# Patient Record
Sex: Female | Born: 1975 | Race: White | Hispanic: No | Marital: Married | Smoking: Never smoker
Health system: Southern US, Academic
[De-identification: ages and names within clinical notes are randomized; demographics above are authoritative.]

---

## 2015-11-29 ENCOUNTER — Observation Stay (EMERGENCY_DEPARTMENT_HOSPITAL): Payer: No Typology Code available for payment source

## 2015-11-29 ENCOUNTER — Telehealth (INDEPENDENT_AMBULATORY_CARE_PROVIDER_SITE_OTHER): Payer: Self-pay | Admitting: Neurology

## 2015-11-29 ENCOUNTER — Observation Stay (HOSPITAL_BASED_OUTPATIENT_CLINIC_OR_DEPARTMENT_OTHER): Payer: No Typology Code available for payment source | Admitting: Neurology

## 2015-11-29 ENCOUNTER — Emergency Department (HOSPITAL_COMMUNITY): Payer: No Typology Code available for payment source

## 2015-11-29 ENCOUNTER — Observation Stay
Admission: EM | Admit: 2015-11-29 | Discharge: 2015-11-30 | Disposition: A | Discharge status: Home / Self Care | Payer: No Typology Code available for payment source | Source: Other Acute Inpatient Hospital | Attending: Internal Medicine | Admitting: Internal Medicine

## 2015-11-29 ENCOUNTER — Observation Stay (HOSPITAL_BASED_OUTPATIENT_CLINIC_OR_DEPARTMENT_OTHER): Payer: No Typology Code available for payment source

## 2015-11-29 DIAGNOSIS — R51 Headache: Secondary | ICD-10-CM

## 2015-11-29 DIAGNOSIS — Z114 Encounter for screening for human immunodeficiency virus [HIV]: Secondary | ICD-10-CM | POA: Insufficient documentation

## 2015-11-29 DIAGNOSIS — R202 Paresthesia of skin: Secondary | ICD-10-CM

## 2015-11-29 DIAGNOSIS — J45909 Unspecified asthma, uncomplicated: Secondary | ICD-10-CM

## 2015-11-29 DIAGNOSIS — G43109 Migraine with aura, not intractable, without status migrainosus: Principal | ICD-10-CM | POA: Insufficient documentation

## 2015-11-29 DIAGNOSIS — Z882 Allergy status to sulfonamides status: Secondary | ICD-10-CM | POA: Insufficient documentation

## 2015-11-29 DIAGNOSIS — R079 Chest pain, unspecified: Secondary | ICD-10-CM

## 2015-11-29 DIAGNOSIS — R200 Anesthesia of skin: Secondary | ICD-10-CM

## 2015-11-29 DIAGNOSIS — R519 Headache, unspecified: Secondary | ICD-10-CM

## 2015-11-29 DIAGNOSIS — Z889 Allergy status to unspecified drugs, medicaments and biological substances status: Secondary | ICD-10-CM | POA: Insufficient documentation

## 2015-11-29 LAB — CBC WITH DIFF
BASOPHIL #: 0.05 x10ˆ3/uL (ref 0.00–0.20)
BASOPHIL %: 1 %
EOSINOPHIL #: 0.23 x10ˆ3/uL (ref 0.00–0.50)
EOSINOPHIL %: 3 %
HCT: 44.9 % (ref 33.5–45.2)
HGB: 15.1 g/dL (ref 11.2–15.2)
LYMPHOCYTE #: 2.66 x10?3/uL (ref 1.00–4.80)
LYMPHOCYTE %: 28 %
MCH: 30.5 pg (ref 27.4–33.0)
MCHC: 33.5 g/dL (ref 32.5–35.8)
MCV: 91 fL (ref 78.0–100.0)
MONOCYTE #: 0.68 x10ˆ3/uL (ref 0.30–1.00)
MONOCYTE %: 7 %
MPV: 10.7 fL (ref 7.5–11.5)
NEUTROPHIL #: 5.76 x10ˆ3/uL (ref 1.50–7.70)
NEUTROPHIL %: 62 %
PLATELETS: 206 x10ˆ3/uL (ref 140–450)
RBC: 4.94 x10ˆ6/uL — ABNORMAL HIGH (ref 3.63–4.92)
RDW: 13.1 % (ref 12.0–15.0)
WBC: 9.4 x10ˆ3/uL (ref 3.5–11.0)

## 2015-11-29 LAB — THYROXINE, FREE (FREE T4): THYROXINE (T4), FREE: 0.88 ng/dL (ref 0.70–1.25)

## 2015-11-29 LAB — ECG 12-LEAD
Atrial Rate: 75 {beats}/min
Calculated P Axis: 53 degrees
Calculated R Axis: 65 degrees
Calculated T Axis: 58 degrees
PR Interval: 126 ms
QRS Duration: 78 ms
QT Interval: 402 ms
QTC Calculation: 448 ms
Ventricular rate: 75 {beats}/min
Ventricular rate: 75 {beats}/min

## 2015-11-29 LAB — BASIC METABOLIC PANEL
ANION GAP: 8 mmol/L (ref 4–13)
ANION GAP: 8 mmol/L (ref 4–13)
BUN/CREA RATIO: 16 (ref 6–22)
BUN: 12 mg/dL (ref 8–25)
CALCIUM: 8.9 mg/dL (ref 8.5–10.2)
CHLORIDE: 105 mmol/L (ref 96–111)
CO2 TOTAL: 26 mmol/L (ref 22–32)
CREATININE: 0.73 mg/dL (ref 0.49–1.10)
ESTIMATED GFR: >59 mL/min/1.73m?2 (ref >59)
GLUCOSE: 84 mg/dL (ref 65–139)
POTASSIUM: 3.8 mmol/L (ref 3.5–5.1)
SODIUM: 139 mmol/L (ref 136–145)

## 2015-11-29 LAB — PT/INR
INR: 1 (ref 0.80–1.20)
PROTHROMBIN TIME: 11.3 s (ref 9.0–13.6)

## 2015-11-29 LAB — TROPONIN-I (FOR ED ONLY): TROPONIN I: <7 ng/L (ref 0–30)

## 2015-11-29 LAB — C-REACTIVE PROTEIN(CRP),INFLAMMATION: CRP INFLAMMATION: <1 mg/L (ref <=8.0)

## 2015-11-29 LAB — TYPE AND SCREEN
ABO/RH(D): A NEG
ANTIBODY SCREEN: NEGATIVE

## 2015-11-29 LAB — PTT (PARTIAL THROMBOPLASTIN TIME): APTT: 31 s (ref 25.1–36.5)

## 2015-11-29 LAB — HIV-1 AND HIV-2 ANTIBODY CONFIRMATION AND DIFFERENTIATION, SERUM
HIV-1 AB DIFFERENTIATION, S: NEGATIVE
HIV-2 AB DIFFERENTIATION, S: UNDETERMINED

## 2015-11-29 LAB — THYROID STIMULATING HORMONE WITH FREE T4 REFLEX: TSH: 6.218 u[IU]/mL — ABNORMAL HIGH (ref 0.350–5.000)

## 2015-11-29 LAB — HCG, SERUM QUALITATIVE, PREGNANCY: PREGNANCY, SERUM QUALITATIVE: NEGATIVE

## 2015-11-29 LAB — VITAMIN B12: VITAMIN B 12: 239 pg/mL (ref 200–1000)

## 2015-11-29 LAB — SEDIMENTATION RATE: ERYTHROCYTE SEDIMENTATION RATE (ESR): 5 mm/h (ref 0–20)

## 2015-11-29 LAB — POC BLOOD GLUCOSE (RESULTS): GLUCOSE, POC: 86 mg/dL (ref 70–105)

## 2015-11-29 LAB — HIV1/HIV2 SCREEN, COMBINED ANTIGEN AND ANTIBODY: HIV SCREEN, COMBINED ANTIGEN & ANTIBODY: REACTIVE — CR

## 2015-11-29 MED ORDER — PROCHLORPERAZINE EDISYLATE 10 MG/2 ML (5 MG/ML) INJECTION SOLUTION
10.0000 mg | Freq: Three times a day (TID) | INTRAMUSCULAR | Status: DC
Start: 2015-11-29 — End: 2015-11-30
  Administered 2015-11-29 (×2): 10 mg via INTRAVENOUS
  Administered 2015-11-30: 0 mg via INTRAVENOUS
  Administered 2015-11-30: 10 mg via INTRAVENOUS
  Filled 2015-11-29 (×6): qty 2

## 2015-11-29 MED ORDER — SODIUM CHLORIDE 0.9 % INTRAVENOUS SOLUTION
INTRAVENOUS | Status: DC
Start: 2015-11-29 — End: 2015-11-29
  Administered 2015-11-29: 0 via INTRAVENOUS

## 2015-11-29 MED ORDER — AMITRIPTYLINE 50 MG TABLET
50.00 mg | ORAL_TABLET | Freq: Every evening | ORAL | Status: DC
Start: 2015-11-29 — End: 2015-11-30
  Administered 2015-11-29: 50 mg via ORAL
  Filled 2015-11-29 (×2): qty 1

## 2015-11-29 MED ORDER — GADOBENATE DIMEGLUMINE 529 MG/ML(0.1 MMOL/0.2 ML) INTRAVENOUS SOLUTION
18.00 mL | INTRAVENOUS | Status: AC
Start: 2015-11-29 — End: 2015-11-29
  Administered 2015-11-29: 22:00:00 18 mL via INTRAVENOUS

## 2015-11-29 MED ORDER — TIZANIDINE 4 MG TABLET
4.00 mg | ORAL_TABLET | Freq: Three times a day (TID) | ORAL | Status: DC | PRN
Start: 2015-11-29 — End: 2015-11-30
  Administered 2015-11-29: 4 mg via ORAL
  Filled 2015-11-29 (×2): qty 1

## 2015-11-29 MED ORDER — CETIRIZINE 10 MG TABLET
10.0000 mg | ORAL_TABLET | Freq: Every day | ORAL | Status: DC
Start: 2015-11-29 — End: 2015-11-30
  Administered 2015-11-29: 0 mg via ORAL
  Administered 2015-11-30: 10 mg via ORAL
  Filled 2015-11-29 (×2): qty 1

## 2015-11-29 MED ORDER — IOPAMIDOL 370 MG IODINE/ML (76 %) INTRAVENOUS SOLUTION
110.00 mL | INTRAVENOUS | Status: AC
Start: 2015-11-29 — End: 2015-11-29
  Administered 2015-11-29: 110 mL via INTRAVENOUS

## 2015-11-29 MED ORDER — DULOXETINE 60 MG CAPSULE,DELAYED RELEASE
60.0000 mg | DELAYED_RELEASE_CAPSULE | Freq: Every day | ORAL | Status: DC
Start: 2015-11-29 — End: 2015-11-30
  Administered 2015-11-29 – 2015-11-30 (×2): 60 mg via ORAL
  Filled 2015-11-29 (×2): qty 1

## 2015-11-29 MED ORDER — MAGNESIUM SULFATE 1 GRAM/100 ML IN DEXTROSE 5 % INTRAVENOUS PIGGYBACK
1.0000 g | INJECTION | Freq: Three times a day (TID) | INTRAVENOUS | Status: DC
Start: 2015-11-29 — End: 2015-11-30
  Administered 2015-11-29: 1 g via INTRAVENOUS
  Administered 2015-11-29: 0 g via INTRAVENOUS
  Administered 2015-11-29: 1 g via INTRAVENOUS
  Administered 2015-11-30: 0 g via INTRAVENOUS
  Administered 2015-11-30: 1 g via INTRAVENOUS
  Filled 2015-11-29 (×7): qty 100

## 2015-11-29 MED ORDER — SODIUM CHLORIDE 0.9 % INTRAVENOUS SOLUTION
INTRAVENOUS | Status: DC
Start: 2015-11-29 — End: 2015-11-30

## 2015-11-29 MED ORDER — ENOXAPARIN 40 MG/0.4 ML SUBCUTANEOUS SYRINGE
40.0000 mg | INJECTION | SUBCUTANEOUS | Status: DC
Start: 2015-11-29 — End: 2015-11-30
  Administered 2015-11-29: 40 mg via SUBCUTANEOUS
  Filled 2015-11-29: qty 0.4
  Filled 2015-11-29: qty 0

## 2015-11-29 MED ORDER — DIPHENHYDRAMINE 50 MG/ML INJECTION SOLUTION
25.0000 mg | Freq: Three times a day (TID) | INTRAMUSCULAR | Status: DC
Start: 2015-11-29 — End: 2015-11-30
  Administered 2015-11-29 (×2): 25 mg via INTRAVENOUS
  Administered 2015-11-30: 0 mg via INTRAVENOUS
  Administered 2015-11-30: 25 mg via INTRAVENOUS
  Filled 2015-11-29 (×3): qty 1

## 2015-11-29 MED ORDER — SODIUM CHLORIDE 0.9 % INTRAVENOUS SOLUTION
INTRAVENOUS | Status: DC
Start: 2015-11-29 — End: 2015-11-29

## 2015-11-29 MED ORDER — ACETAMINOPHEN 325 MG TABLET
650.0000 mg | ORAL_TABLET | ORAL | Status: DC | PRN
Start: 2015-11-29 — End: 2015-11-29

## 2015-11-29 MED ORDER — SODIUM CHLORIDE 0.9 % IV BOLUS
500.0000 mL | INJECTION | Freq: Once | Status: AC
Start: 2015-11-29 — End: 2015-11-29
  Administered 2015-11-29: 500 mL via INTRAVENOUS

## 2015-11-29 MED ORDER — ACETAMINOPHEN 325 MG TABLET
650.0000 mg | ORAL_TABLET | ORAL | Status: DC | PRN
Start: 2015-11-29 — End: 2015-11-30

## 2015-11-29 MED ADMIN — PRISMASATE (BICARBONATE-BASED) 4K DIALYSATE: INTRAVENOUS | @ 06:00:00

## 2015-11-29 MED ADMIN — artificial tears with lanolin eye ointment: INTRAVENOUS | @ 07:00:00 | NDC 00023031204

## 2015-11-29 MED ADMIN — lactated Ringers intravenous solution: INTRAVENOUS | @ 07:00:00 | NDC 00264775000

## 2015-11-29 NOTE — ED Provider Notes (Signed)
Department of Emergency Medicine    CC:  Stroke page    HPI:  11/29/2015, 07:26  40 y.o.female Stroke Page.    EMS Reported Last Seen Normal:  21 30  Official Last Seen Normal (Used for TPA eligibility):  2130  Reason for discrepancy between EMS last seen normal and official last seen normal:  Not applicable    Gloria Nielsen is a 40 y.o. female presenting to the ED via EMS as a transfer from [facility] port.  Patient states that she woke up at 21:45 experiencing left facial droop and left-sided numbness.  Patient states that she went to bed at 2130.  Patient reports having improvement in her left-sided numbness; however it continues to persist.  Patient was evaluated at [facility].  CT head was negative and telemedicine was performed.  Patient was advised to come to [facility] for further evaluation.  On route, patient had a fingerstick of 105. Patient currently complaining of a headache. No previous history of stroke.  History is significant for migraine and PNES.   Patient reports that she has been having severe migraines in the last few weeks.  Patient has been adherent to her migraine medication.        Review of Systems:  Constitutional: No fever, chills or np weakness   Skin: No rash or diaphoresis  HENT:  Positive headache  Eyes: No vision changes   Cardio: No chest pain, palpitations or leg swelling   Respiratory: No cough, wheezing or SOB  GI:  No nausea, vomiting or stool changes  GU:  No urinary changes  MSK: No joint or back pain  Neuro: No seizures, LOC, weakness or dysarthria.  Positive for left-sided numbness and tingling  Psychiatric: No depression, SI or substance abuse  All other systems reviewed and are negative or as noted in HPI.    History  PMH:  Migraine and PNES    Previous Medications    ALBUTEROL SULFATE (PROVENTIL OR VENTOLIN OR PROAIR) 90 MCG/ACTUATION INHALATION HFA AEROSOL INHALER    Take 1-2 Puffs by inhalation Every 6 hours as needed    AMITRIPTYLINE (ELAVIL) 50 MG ORAL TABLET     Take 50 mg by mouth Every night    CETIRIZINE (ZYRTEC) 10 MG ORAL TABLET    Take 10 mg by mouth Once a day    DULOXETINE (CYMBALTA DR) 60 MG ORAL CAPSULE, DELAYED RELEASE(E.C.)    Take 60 mg by mouth Once a day    RIZATRIPTAN (MAXALT) 10 MG ORAL TABLET    Take 10 mg by mouth Once, as needed for Migraine May repeat in 2 hours if       PSH:  No past surgical history on file.      Social Hx:    Social History   Substance Use Topics    Smoking status: Not on file    Smokeless tobacco: Not on file    Alcohol use Not on file     Family Hx: No family history on file.  Allergies:   Allergies   Allergen Reactions    Allegra [Fexofenadine] Rash    Sulfa (Sulfonamides) Rash    Tegretol [Carbamazepine] Rash       Above history reviewed with patient, changes are as documented.      Physical Exam:   Nursing notes reviewed.    ED Triage Vitals   Enc Vitals Group      BP (Non-Invasive) 11/29/15 0551 127/75      Heart Rate 11/29/15  0551 87      Respiratory Rate 11/29/15 0551 20      Temperature 11/29/15 0551 36.6 C (97.9 F)      Temp src --       SpO2-1 11/29/15 0551 98 %      Weight 11/29/15 0551 89.8 kg (197 lb 15.9 oz)      Height --       Head Cir --       Peak Flow --       Pain Score --       Pain Loc --       Pain Edu? --       Excl. in GC? --      Constitutional: NAD. Oriented  HENT:   Head: Normocephalic and atraumatic.   Mouth/Throat: Oropharynx is clear and moist.   Eyes: EOMI, PERRL   Neck: Normal ROM. Neck supple.  Cardiovascular: RRR. No murmur, rubs or gallops. Intact distal pulses.  Pulmonary/Chest: BS equal bilaterally. No respiratory distress. No wheezes, rales or tenderness.   Abdominal: BS +. Abdomen soft, no tenderness, rebound or guarding.               Musculoskeletal: No edema, tenderness or deformities   Skin: warm and dry. No rash, erythema or pallor.   Psychiatric: normal mood and affect. Behavior is normal.   Neuro: NIH Stroke Scale completed - following deficits identified:  Decreased sensation  to the left upper extremity . Facies symmetric. Able to raise eyebrows, close eyes, smile, puff mouth, stick out tongue, move tongue left and right and raise palate symmetrically. Able to shrug shoulders. PERRLA. SILT to forehead below eye and at jawline. Can hear soft nose bilaterally.   Good finger to nose    Course and MDM:  Course:  Pt was paged as a Stroke Page. Stroke team was present upon arrival of patient to the ED.    Initial evaluation was completed jointly by ED and Neurology house staff. Stroke protocol labs and radiographical studies were initiated. NIH Stroke Scale of  1 was obtained.    Orders Placed This Encounter    XR AP MOBILE CHEST    CT STROKE PROTOCOL    CBC/DIFF    BASIC METABOLIC PANEL, NON-FASTING    PT/INR    PTT (PARTIAL THROMBOPLASTIN TIME)    TROPONIN-I (FOR ED ONLY)    VENOUS BLOOD GAS/K+/ICA++/CO-OX    HCG, SERUM QUALITATIVE, PREGNANCY    URINALYSIS, MACROSCOPIC AND MICROSCOPIC W/CULTURE REFLEX    CBC WITH DIFF    URINALYSIS, MACROSCOPIC    HIV1/HIV2 SCREEN, COMBINED ANTIGEN AND ANTIBODY    SEDIMENTATION RATE    C-REACTIVE PROTEIN(CRP),INFLAMMATION    VITAMIN B12    THYROID STIMULATING HORMONE WITH FREE T4 REFLEX    THYROXINE, FREE (FREE T4)    OXYGEN - NASAL CANNULA    ECG 12-LEAD    PERFORM POC WHOLE BLOOD GLUCOSE    TYPE AND SCREEN    INSERT & MAINTAIN PERIPHERAL IV ACCESS [ 2 [facility] ]    PATIENT CLASS/LEVEL OF CARE DESIGNATION    NS premix infusion    prochlorperazine (COMPAZINE) 5 mg/mL injection    diphenhydrAMINE (BENADRYL) 50 mg/mL injection    magnesium sulfate 1 G in D5W 100 mL premix IVPB    tiZANidine (ZANAFLEX) tablet    acetaminophen (TYLENOL) tablet    iopamidol (ISOVUE-370) 76% infusion       Labs Reviewed   CBC WITH DIFF -  Abnormal; Notable for the following:        Result Value    RBC 4.94 (*)     All other components within normal limits   THYROID STIMULATING HORMONE WITH FREE T4 REFLEX - Abnormal; Notable for  the following:     TSH 6.218 (*)     All other components within normal limits    Narrative:     "STROKE PAGE"   BASIC METABOLIC PANEL - Normal    Narrative:     "STROKE PAGE"  Hemolysis can alter results at this level (slight).   PT/INR - Normal    Narrative:     Coumadin therapy INR range for Conventional Anticoagulation is 2.0 to 3.0 and for Intensive Anticoagulation 2.5 to 3.5.   PTT (PARTIAL THROMBOPLASTIN TIME) - Normal    Narrative:     Therapeutic range for unfractionated heparin is 60.0-100.0 seconds.   TROPONIN-I (FOR ED ONLY) - Normal    Narrative:     "STROKE PAGE"   HCG, SERUM QUALITATIVE, PREGNANCY - Normal    Narrative:     Sensitivity of the qualitative pregnancy test is 25 mIU hCG/mL.  If pregnancy is strongly considered, perform quantitative hCG or repeat in 48 hours.   SEDIMENTATION RATE - Normal   C-REACTIVE PROTEIN(CRP),INFLAMMATION - Normal    Narrative:     "STROKE PAGE"   VITAMIN B12 - Normal   POC BLOOD GLUCOSE (RESULTS) - Normal    Narrative:     RN Notified   CBC/DIFF    Narrative:     The following orders were created for panel order CBC/DIFF.  Procedure                               Abnormality         Status                     ---------                               -----------         ------                     CBC WITH GUYQ[[id number]]                Abnormal            Final result                 Please view results for these tests on the individual orders.   URINALYSIS, MACROSCOPIC AND MICROSCOPIC W/CULTURE REFLEX    Narrative:     The following orders were created for panel order URINALYSIS, MACROSCOPIC AND MICROSCOPIC W/CULTURE REFLEX.  Procedure                               Abnormality         Status                     ---------                               -----------         ------  URINALYSIS, MACROSCOPIC[[id number]]                                                       Please view results for these tests on the individual orders.   URINALYSIS,  MACROSCOPIC   HIV1/HIV2 SCREEN, COMBINED ANTIGEN AND ANTIBODY   THYROXINE, FREE (FREE T4)   PERFORM POC WHOLE BLOOD GLUCOSE   TYPE AND SCREEN   POC ISTAT CREATININE (RESULT)     The patient is out of the tPA window    Meds that were given are noted on the nursing chart.  Medications   NS premix infusion ( Intravenous New Bag/New Syringe 11/29/15 [phone])   prochlorperazine (COMPAZINE) 5 mg/mL injection (10 mg Intravenous Given 11/29/15 0658)   diphenhydrAMINE (BENADRYL) 50 mg/mL injection (25 mg Intravenous Given 11/29/15 0658)   magnesium sulfate 1 G in D5W 100 mL premix IVPB (not administered)   tiZANidine (ZANAFLEX) tablet (not administered)   acetaminophen (TYLENOL) tablet (not administered)   iopamidol (ISOVUE-370) 76% infusion (110 mL Intravenous Given 11/29/15 0650)       EKG:  NSR. No prolonged PR interval. No QRS widening. No STE, No ST depression. No TWI. No QT prolongation. Nml axis. Nml R wave progression. No ischemic changes concerning for MI, No arrhythmia      Radiographical Imaging:   Results for orders placed or performed during the hospital encounter of 11/29/15 (from the past 72 hour(s))   CT STROKE PROTOCOL     Status: None (Preliminary result)    Narrative    Margarette Asal  Female, 40 years old.    CT STROKE PROTOCOL performed on 11/29/2015 7:07 AM.    REASON FOR EXAM:  pulsatile headache    RADIATION DOSE: 4965.70 mGycm    TECHNIQUE: Unenhanced head CT was obtained.  Following intravenous contrast  administration of 110 ml's of Isovue 370, intracranial and extracranial  angiographic imaging was performed along with perfusion imaging.    COMPARISON: None available.    FINDINGS:  NONCONTRAST HEAD CT:  Gray-white matter differentiation is preserved. No  intracranial hemorrhages or extra-axial fluid collections are identified.   There is no midline shift.  There is no evidence of hydrocephalus.  The  mastoid air cells and the paranasal sinuses are well aerated.  No acute  osseous abnormalities are  identified.  The orbits are unremarkable.    INTRACRANIAL CT ANGIOGRAPHY:  The vertebrobasilar system is unremarkable.    The cerebellar arteries are patent.  Bilateral PCAs are unremarkable.  The  visualized bilateral internal carotid arteries are unremarkable.  Bilateral  ACAs and MCAs are unremarkable.  There are no flow-limiting stenosis,  complete occlusion, or vascular malformations.    EXTRACRANIAL CT ANGIOGRAPHY:  There is normal branching configuration of  the aortic arch. The origins of the bilateral common carotid and vertebral  arteries are patent.  There is a codominant vertebral artery system.   Cervical course of the vertebral, common carotid, and internal carotid  arteries is unremarkable. There is no flow-limiting stenosis, complete  occlusion, or blunt cerebrovascular trauma.    PERFUSION IMAGING: There are no regions of hypoperfusion within a major  arterial distribution to suggest a large vessel occlusion.    CT NECK:  The visualized lungs demonstrate no acute abnormality. No acute  osseous abnormalities identified. The thyroid is unremarkable.  Impression    1. Unenhanced head CT demonstrating no abnormal gray-white matter  differentiation or acute intracranial process.  2. Angiographic imaging demonstrating no evidence for thrombosis or  stenosis within any major artery.  3. Perfusion imaging demonstrates no perfusion deficit.         All labs, imaging and previous records were reviewed.  Patient is out of tPA window.  Limited differential includes stroke, TIA, complex migraine, hypoglycemia, Todd's paralysis, brain mass..  Low concern for Todd's paralysis based on lack of seizure activity.  Labs as above.  EKG as above.  Given patient's history of migraine and worsening of chronic migraines, neuro wish to perform CT imaging.  CT stroke protocol protocol demonstrated "1. Unenhanced head CT demonstrating no abnormal gray-white matterdifferentiation or acute intracranial process.  2.  Angiographic imaging demonstrating no evidence for thrombosis orstenosis within any major artery.3. Perfusion imaging demonstrates no perfusion deficit."  Patient to be admitted to Neurology service      For further information refer to Neurology H&P.    Disposition: Admitted   Patient will be admitted to  neurology service for further evaluation and management.      Care of patient will be transferred to Dr. Gary FleetHundley at this time.  They were made aware of history/physical, relevant labs/imaging and pending studies.         Clinical Impression:   Left-sided numbness and tingling, worsening migraines  London PepperPaulina Shifra Swartzentruber, MD  11/29/2015, 05:59

## 2015-11-29 NOTE — ED Resident Handoff Note (Signed)
Allergies   Allergen Reactions   . Allegra [Fexofenadine] Rash   . Sulfa (Sulfonamides) Rash   . Tegretol [Carbamazepine] Rash       Filed Vitals:    11/29/15 0551   BP: 127/75   Pulse: 87   Resp: 20   Temp: 36.6 C (97.9 F)   SpO2: 98%       HPI:  In brief, Margarette Asalina Hayden is a 40 y.o. female presents with transfer from [facility],  Concern for stroke.  Woke up with facial droop.   CT non con neg.    Wake up stroke     Pertinent Exam Findings:  decreased sensation to L side  Otherwise neuro intact    Pertinent Imaging/Lab results:  Labs Reviewed   CBC WITH DIFF - Abnormal; Notable for the following:        Result Value    RBC 4.94 (*)     All other components within normal limits   THYROID STIMULATING HORMONE WITH FREE T4 REFLEX - Abnormal; Notable for the following:     TSH 6.218 (*)     All other components within normal limits    Narrative:     "STROKE PAGE"   BASIC METABOLIC PANEL - Normal    Narrative:     "STROKE PAGE"  Hemolysis can alter results at this level (slight).   PT/INR - Normal    Narrative:     Coumadin therapy INR range for Conventional Anticoagulation is 2.0 to 3.0 and for Intensive Anticoagulation 2.5 to 3.5.   PTT (PARTIAL THROMBOPLASTIN TIME) - Normal    Narrative:     Therapeutic range for unfractionated heparin is 60.0-100.0 seconds.   TROPONIN-I (FOR ED ONLY) - Normal    Narrative:     "STROKE PAGE"   HCG, SERUM QUALITATIVE, PREGNANCY - Normal    Narrative:     Sensitivity of the qualitative pregnancy test is 25 mIU hCG/mL.  If pregnancy is strongly considered, perform quantitative hCG or repeat in 48 hours.   SEDIMENTATION RATE - Normal   C-REACTIVE PROTEIN(CRP),INFLAMMATION - Normal    Narrative:     "STROKE PAGE"   VITAMIN B12 - Normal   POC BLOOD GLUCOSE (RESULTS) - Normal    Narrative:     RN Notified   CBC/DIFF    Narrative:     The following orders were created for panel order CBC/DIFF.  Procedure                               Abnormality         Status                      ---------                               -----------         ------                     CBC WITH ZOXW[[id number]]IFF[[id number]]                Abnormal            Final result                 Please view results for these tests on the individual orders.   URINALYSIS, MACROSCOPIC AND  MICROSCOPIC W/CULTURE REFLEX    Narrative:     The following orders were created for panel order URINALYSIS, MACROSCOPIC AND MICROSCOPIC W/CULTURE REFLEX.  Procedure                               Abnormality         Status                     ---------                               -----------         ------                     URINALYSIS, MACROSCOPIC[[id number]]                                                       Please view results for these tests on the individual orders.   URINALYSIS, MACROSCOPIC   HIV1/HIV2 SCREEN, COMBINED ANTIGEN AND ANTIBODY   THYROXINE, FREE (FREE T4)   PERFORM POC WHOLE BLOOD GLUCOSE   TYPE AND SCREEN   POC ISTAT CREATININE (RESULT)       Results for orders placed or performed during the hospital encounter of 11/29/15 (from the past 72 hour(s))   CT STROKE PROTOCOL     Status: None (Preliminary result)    Narrative    Margarette AsalINA Storts  Female, 40 years old.    CT STROKE PROTOCOL performed on 11/29/2015 7:07 AM.    REASON FOR EXAM:  pulsatile headache    RADIATION DOSE: 4965.70 mGycm    TECHNIQUE: Unenhanced head CT was obtained.  Following intravenous contrast  administration of 110 ml's of Isovue 370, intracranial and extracranial  angiographic imaging was performed along with perfusion imaging.    COMPARISON: None available.    FINDINGS:  NONCONTRAST HEAD CT:  Gray-white matter differentiation is preserved. No  intracranial hemorrhages or extra-axial fluid collections are identified.   There is no midline shift.  There is no evidence of hydrocephalus.  The  mastoid air cells and the paranasal sinuses are well aerated.  No acute  osseous abnormalities are identified.  The orbits are unremarkable.    INTRACRANIAL CT ANGIOGRAPHY:   The vertebrobasilar system is unremarkable.    The cerebellar arteries are patent.  Bilateral PCAs are unremarkable.  The  visualized bilateral internal carotid arteries are unremarkable.  Bilateral  ACAs and MCAs are unremarkable.  There are no flow-limiting stenosis,  complete occlusion, or vascular malformations.    EXTRACRANIAL CT ANGIOGRAPHY:  There is normal branching configuration of  the aortic arch. The origins of the bilateral common carotid and vertebral  arteries are patent.  There is a codominant vertebral artery system.   Cervical course of the vertebral, common carotid, and internal carotid  arteries is unremarkable. There is no flow-limiting stenosis, complete  occlusion, or blunt cerebrovascular trauma.    PERFUSION IMAGING: There are no regions of hypoperfusion within a major  arterial distribution to suggest a large vessel occlusion.    CT NECK:  The visualized lungs demonstrate no acute abnormality. No acute  osseous abnormalities identified. The thyroid is unremarkable.  Impression    1. Unenhanced head CT demonstrating no abnormal gray-white matter  differentiation or acute intracranial process.  2. Angiographic imaging demonstrating no evidence for thrombosis or  stenosis within any major artery.  3. Perfusion imaging demonstrates no perfusion deficit.         Pending Studies:  Nothing     Consults:  Neurology     Plan:  Admit to neurology   After a thorough discussion of the patient including presentation, ED course, and review of above information I have assumed care of Margarette Asal from Dr. Allena Katz at 07:24      Irene Limbo, MD 11/29/2015, 07:24      Course:        Results up to the Time the Disposition was Entered   CBC WITH DIFF - Abnormal; Notable for the following:        Result Value    RBC 4.94 (*)     All other components within normal limits   PT/INR - Normal    Narrative:     Coumadin therapy INR range for Conventional Anticoagulation is 2.0 to 3.0 and for Intensive  Anticoagulation 2.5 to 3.5.   PTT (PARTIAL THROMBOPLASTIN TIME) - Normal    Narrative:     Therapeutic range for unfractionated heparin is 60.0-100.0 seconds.   POC BLOOD GLUCOSE (RESULTS) - Normal    Narrative:     RN Notified   CBC/DIFF    Narrative:     The following orders were created for panel order CBC/DIFF.  Procedure                               Abnormality         Status                     ---------                               -----------         ------                     CBC WITH ZOXW[[id number]]                Abnormal            Final result                 Please view results for these tests on the individual orders.   ECG 12-LEAD   INSERT & MAINTAIN PERIPHERAL IV ACCESS   POC ISTAT CREATININE (RESULT)   PERFORM POC WHOLE BLOOD GLUCOSE   URINALYSIS, MACROSCOPIC AND MICROSCOPIC W/CULTURE REFLEX    Narrative:     The following orders were created for panel order URINALYSIS, MACROSCOPIC AND MICROSCOPIC W/CULTURE REFLEX.  Procedure                               Abnormality         Status                     ---------                               -----------         ------  URINALYSIS, MACROSCOPIC[[id number]]                                                       Please view results for these tests on the individual orders.   DIET NPO - NOW   NURSING: ON COMPLETION OF SWALLOW EVAL   OXYGEN - NASAL CANNULA   PULSE OXIMETRY   VITAL SIGNS MULTI FREQUENCIES ED   NEURO CHECKS MULTI-FREQUENCY   CONTINUOUS CARDIAC MONITORING (ED USE ONLY)   URINALYSIS, MACROSCOPIC   XR AP MOBILE CHEST   NS premix infusion ( Intravenous New Bag/New Syringe 11/29/15 [phone])   prochlorperazine (COMPAZINE) 5 mg/mL injection (not administered)   diphenhydrAMINE (BENADRYL) 50 mg/mL injection (not administered)   magnesium sulfate 1 G in D5W 100 mL premix IVPB (not administered)   tiZANidine (ZANAFLEX) tablet (not administered)   acetaminophen (TYLENOL) tablet (not administered)   iopamidol (ISOVUE-370) 76% infusion  (not administered)         CT stroke protocol with no acute findings.           Disposition:    Patient will be admitted to Neurology service for further evaluation and management.            Irene Limbo, MD 11/29/2015, 07:24  PGY-3   Department of Emergency Medicine

## 2015-11-29 NOTE — Progress Notes (Signed)
Telestroke Consult Note    Gloria Nielsen,Gloria Nielsen, 40 y.o. female  MRN:  [id number]     Date of Admission:  (Not on file)  Date of Birth:  09/11/1975  Date of Service:  11/29/2015  Note Time: 10:59       [facility]  Consult Requested by: Dr. Everardo BealsGeary  Time of Page: 11/29/2015  2322  Time of Connection:  2327       History Obtained From: Patient and her husband      Chief Complaint: Left sided weakness    History of Present Illness:  Gloria Nielsen is a 40 y.o. female with a history of depression and migraine who presented with left sided weakness. She went to bed and woke up 15 minutes later with these symptoms at 2145. She had slurred speech and inability to move her left side. Telestroke was called for further eval.     Patient presented with Husband    Last Known Well: 2130      PHYSICAL EXAMINATION (indirectly performed by observation via teleconference)  Secure teleconferencing connection was sufficient for examination:  yes    Not moving either side of face  Following all commands  EOMI  Speech not really dysarthric but more non-neurological sounding  Trace movement in left arm and leg    NIHSS (approximate)= 10                                                 I personally reviewed the following images.  My findings and interpretations are as follows:  CT brain w/o contrast  No blood  CTA (if available)  NA    Assessment:  1. Ischemic Stroke: Unlikley    Recommendations:  1. IV tPA administration:  no  2. CTA/ Endovascular Evaluation:  no  3. Transfer for higher level of care:  yes    Gloria Nielsen is a 40 yo F with h/o migraines and depression who [facility] with left sided weakness and abnormal speech in association with a headache. Her exam participation was poor and not entirely consistent with a stroke. My suspicion was low for a stroke but I could not rule it out completely. I offered her treatment with IV tPA even though the likelihood of stroke was low. She and her husband decided not to be  treated with tPA. [facility].       Huel CoteMuhammad Doninique Lwin, MD  11/29/2015, 10:59

## 2015-11-29 NOTE — ED Nurses Note (Signed)
Voice care recorded 5W room 520.

## 2015-11-29 NOTE — H&P (Signed)
NIH Stroke Scale    Inital assessment  Date of EXAM: 11/29/15        Time of EXAM: 0550  Person Administering Scale: Enis GashVincent Arnone, MD      1a.  0 Level of consciousness:     0 = Alert; keenly responsive.   1 = Not alert; but arousable by minor stimulation   2 = Not alert; requires repeated stimulation  3 = COMA   1b. 0 LOC questions:   Ask patient month/Their age.   0 = Answers both questions correctly.   1 = Answers one question correctly.   2 = Answers neither question correctly.     1c. 0 LOC commands:  Ask patient to open/close eyes.   0 = OBEYS both tasks correctly.   1 = OBEYS one task correctly.   2 = OBEYS neither task correctly.     2.  0 Best Gaze:  Only horizontal eye movements will be tested.   0 = Normal.   1 = Partial gaze palsy  2 = Forced deviation   3. 0  Visual:   0 = No visual loss.   1 = Partial hemianopia.   2 = Complete hemianopia.   3 = Bilateral hemianopia (blind including cortical blindness).       4. 0 Facial Palsy:  Ask patient to show teeth or raise eyebrows and close eyes. 0 = Normal symmetrical movements.   1 = Minor paralysis (flattened nasolabial fold, asymmetry on smiling).   2 = Partial paralysis (total or near-total paralysis of lower face).   3 = Complete paralysis of one or both sides (absence of facial movement in the upper and lower face).     5a. 0  Motor left arm:     0 = No drift; limb holds 90 (or 45) degrees for full 10 seconds.   1 = Drift  2 = Some effort against gravity  3 = No effort against gravity  4 = No movement.   9 = UNTESTABLE(Joint fused/limb amputated)     5b.  0 Motor right arm:     0 = No drift; limb holds 90 (or 45) degrees for full 10 seconds.   1 = Drift  2 = Some effort against gravity  3 = No effort against gravity  4 = No movement.   9 = UNTESTABLE(Joint fused/limb amputated)     6a. 0 Motor left leg:     0 = No drift; leg holds 30-degree position for full 5 seconds.   1 = Drift  2 = Some effort against gravity  3 = No effort against gravity  4 = No  movement.   9 = UNTESTABLE(Joint fused/lim amputated)       6b.  0 Motor right leg:      0 = No drift; leg holds 30-degree position for full 5 seconds.   1 = Drift  2 = Some effort against gravity  3 = No effort against gravity  4 = No movement.   9 = UNTESTABLE(Joint fused/lim amputated)     7. 0 Limb Ataxia:     0 = No Ataxia  1 = Present in 1 limb.   2 = Present in 2 limbs.         8.  1 Sensory:  Use pinprick to test arms/legs/trunk/face,compare side to side.   0 = Normal; no sensory loss.   1 = Mild-to-moderate sensory loss  2 = Severe to total sensory  loss     9. 0 Best Language:   Describe picture/name items/read sentences.   0 = No aphasia; normal.  1 = Mild-to-moderate aphasia  2 = Severe aphasia  3 = Mute       10. 0 Dysarthria:   (Read several words)     0 = Normal Articulation  1 = Mild-to-moderate slurring  2 = near unintelligible or unalbe to speak  9 = Intubated or other physical barrier   11.  0 Extinction and Inattention:     0 = Normal  1 = Inattention or extinction to bilateral simultaneous stimulation in one sensory modality  2 = Severe Hemi-inattention or Hem-attention to more than one modality            Total SCORE:  1     Enis GashVincent Arnone, MD  11/29/2015, 06:08

## 2015-11-29 NOTE — ED Attending Note (Signed)
Note begun by: Ezzie Duralharles R Qais Jowers, MD  11/29/2015, 05:58    I was physically present and directly supervised this patient's care.  Patient was seen and examined.  The resident/NP/PA's history and exam were reviewed.  Key elements in addition to and/or correction of that documentation are as follows:  Vitals  Pulse 87, temperature 36.6 C (97.9 F), resp. rate 20, SpO2 98 %..  This is a 40 y.o. female.   Transferred for eval of acute neurological event. l side weakness and r leg weakness. patient declined tpa at outside facility. Hx migraine and seizure.           Old records were reviewed.      Clinical Impression:     Encounter Diagnoses   Name Primary?    Numbness and tingling Yes    Worsening headaches        Critical Care:  None    Some of the documentation has been completed after the conclusion of patient care due to bedside patient care needs and the acuity of the department.

## 2015-11-29 NOTE — Nurses Notes (Signed)
Blood cultures drawn and sent to lab per orders.

## 2015-11-29 NOTE — ED Nurses Note (Signed)
Patient arrived to ED 32 as stroke page with c/o left sided facial tingling. Patient has hx of migraines, states that she has had worsening migraines for the last week. Current symptoms similar to previous migraines, but more intense than usual tonight. LSN 2145. FS 86. NIH completed. 20g PIV to LFA and RFA present prior to admission,  bloodwork drawn from site and sent to lab. Neurology at bedside, stroke page downgraded at this time.

## 2015-11-29 NOTE — ED Nurses Note (Signed)
Pt resting comfortably in stretcher with no complaints at this time.

## 2015-11-29 NOTE — Incoming ED Transfer Note (Signed)
00:44    [facility]      HPI:  Onset symptoms at 945 pm. NIH score 20, flaccid l side, ct negative, offered tpa and patient and family refused tpa.  Hx of stroke and seizure.     PE:   l side weak some improvement with time    Imaging:     CT Head:  No Bleed      Significant Labs:  normal*    Mode of Transport:  Ground,     Outside facility Spoke with:  Neurology by telestroke consult, tpa recommended and declined by patient and family.     Stroke page

## 2015-11-29 NOTE — Ancillary Notes (Signed)
[facility]  MRI Technologist Note        MRI has been completed.        Sindy MessingOlivia Hare 11/29/2015, 22:27

## 2015-11-29 NOTE — ED Nurses Note (Signed)
Dr. Gary FleetHundley at bedside to inform patient that her HIV screening was reactive. Patient had no questions regarding test. Informed that blood sample would be sent for more sensitive screening. Will monitor for needs.

## 2015-11-29 NOTE — Nurses Notes (Signed)
Notified neuro 1 patient heart rate 160's, then returned to low 100's.  Patient asymptomatic at this time.

## 2015-11-29 NOTE — Nurses Notes (Signed)
Pt cleared for MRI. Pt states she is claustro but does not want premed. Pt states she had mri few weeks ago and did ok

## 2015-11-29 NOTE — ED Nurses Note (Signed)
Patient's family brought to bedside. Awaiting admission. Will continue to monitor.

## 2015-11-29 NOTE — ED Attending Handoff Note (Signed)
Gloria Nielsen    02/17/1976      Chief Complaint   Patient presents with   . Stroke- S&S     pt sent from outside facility for lt sided weakness, facial droop at 2145.  pt has been having intermittent headaches for the past couple weeks. FS 86 on arrival          Plan:    - admit to Neuro for stroke/Complex migraine.  - HIV screen positive, discussed with pt.      Dispo:    -admit to Neuro    [name], MD  11/29/2015, 07:11

## 2015-11-29 NOTE — Nurses Notes (Signed)
Notified by tele patient heart rate 150's,  Then returned to low 100's.  Patient resting in bed, asymptomatic.    Notified service and NS bolus ordered.  Will continue to monitor.

## 2015-11-29 NOTE — Progress Notes (Signed)
[facility]  Neuro Cross Coverage Note    Gloria Nielsen  Date of service: 11/29/2015    Interval/Cross coverage update  Called for patient being tachycardic to  160s.  Checked chart and patient has been running borderline low BP  This may be secondary to pain or infectious etiology  Results shows HIV positive for CD4/CD8 ordered  No leukocytosis and afebrile but in the setting of HIV, we will order infectious work up  Reviewed chest xray - negative for infectious process  -Blood and UA/urine culture  -50 cc normal saline bolus  -We will also treat her headache with a headache cocktail    Cindee LameEvan P Fogha, MD

## 2015-11-29 NOTE — H&P (Signed)
[facility]     Stroke H&P            Gloria AsalWeikle,Rozlynn, 40 y.o. female  Date of Admission:  11/29/2015  Date of Birth:  08/08/1975    PCP: Pcp Not In System  Consult Requested By: ED    Information Obtained from: patient, health care provider and history reviewed via medical record  Chief Complaint:  Headache, left sided numbness, left sided weakness    HPI:  Gloria Asalina Rumpf is a 40 y.o. woman with history of migraines since childhood and PNES who presented to the ED as a stroke page for headache and left sided numbness that started at 2145 yesterday evening. Patient has had migraines since she was a child and does have a history of some facial numbness symptoms. Her difference today was that her headache woke her from sleep and radiated from the back of the left side of her head to the left frontal region and was throbbing in nature, then she had onset of left sided numbness and weakness which was new to her, also had some right sided facial numbness symptoms. She feels like very sharp pains are coming from the left back part of her head, which is new. She was evaluated for tPA during telestroke and she does not believe she had a stroke so she declined. Her symptoms have since resolved except for headache and left dorsal hand numbness only. Headache is 8/10 at this time and does feel like her migraines, she also has nausea and photophobia. She takes Cymbalta 60 mg daily, elavil 50 mg qhs and maxalt as an abortive (has also been on topamax and propranolol before and they didn't help). Her headaches have been worsening for the past month and she is unsure why, now having headaches daily, previously was about twice per month. No changes to medications or sleep. She did have increase in stress as her husband has been having issues with chronic pain and an infected spinal cord stimulator. Her sleep is "terrible" and has been bad for a long time, only about two hours per night. Takes Excedrin migraine about four times a  week only. Her migraines also tend to get worse with menses. No seizure-like activity today. No recent illness. No fever or chills. Headaches are worse when she sits up, not laying flat.     Patient Arrival Time:  0546  Admission [facility]    Was this a Stroke page: Yes, Time Stroke Page was called: 05:19   Time Radiology Notified: N/A  Time Anesthesia notified: N/A    Was the Stroke Page downgraded: YES    Team at Bedside: 05:46    Last Known Well Date/Time:  11/28/2015 at 2145    NIH Stroke Scale on arrival: 1    Time CT reviewed: 06:20     TPA Considered:  yes  Was TPA given prior to arrival: No - Was TPA administered upon admission?   No- Reason TPA not given  Out of Window and Not a stroke    Advance Directives:  None-Discussed  Hospice involvement prior to admission?  no    PMH: Asthma, migraines, PNES    PSH: Cholecystectomy    Medications Prior to Admission     None          Current Facility-Administered Medications:  NS premix infusion  Intravenous Continuous     Allergies   Allergen Reactions    Allegra [Fexofenadine] Rash  Sulfa (Sulfonamides) Rash    Tegretol [Carbamazepine] Rash     Social History   Substance Use Topics    Smoking status: No    Smokeless tobacco: No    Alcohol use No     PFH: Mother with migraines (now better after hysterectomy)     ROS: Other than ROS in the HPI, all other systems were negative.    EXAM:  Temperature: 36.6 C (97.9 F)  Heart Rate: 87  BP (Non-Invasive): 127/75  Respiratory Rate: 20  SpO2-1: 98 %  Pain Score (Numeric, Faces): 8  General: appears in good health, appears stated age, no distress and vital signs reviewed  ENT: ENMT without erythema or injection, mucous membranes moist.  Neck: no thyromegaly or lymphadenopathy  Carotids:Carotids normal without bruit  Respiratory: Clear to auscultation bilaterally.   Cardiovascular: regular rate and rhythm  Gastrointestinal: Soft, non-tender  Genitourinary:  Deferred  Musculoskeletal: Head atraumatic and normocephalic  Lymphatic/Immunologic/Hematologic: No lymphadenopathy  Psychiatric: Normal  Ophthalomscopic: Visualized vessels were normal  Glasgow: Eye opening: 4 spontaneous, Verbal resonse:  5 oriented, Best motor response:  6 obeys commands  GCS Total Score: 15  Mental status:  Level of Consciousness: alert  Orientations: Alert and oriented x 3  MemoryRegistration, Recall, and Following of commands is normal  AttentionsAttention and Concentration are normal  Knowledge: Good  Language: Normal  Speech: Normal  Cranial nerves:   CN2: Visual fields intact  CN 3,4,6: EOMI, PERRLA  CN 5Facial sensation intact  CN 7Face symmetrical  CN 8: Hearing grossly intact  CN 9,10: Palate symmetric   CN 11: Sternocleidomastoid and Trapezius have normal strength.  CN 12: Tongue normal with no fasiculations or deviation  Gait, Coordination, and Reflexes:   Gait: Normal  Coordination: Coordination is normal without tremor with FNF bilaterally  Muscle tone: WNL  Muscle exam:  Arm Right Left Leg Right Left   Deltoid 5/5 5/5 Iliopsoas 5/5 5/5   Biceps 5/5 5/5 Quads 5/5 5/5   Triceps 5/5 5/5 Hamstrings 5/5 5/5   Wrist Extension 5/5 5/5 Ankle Dorsi Flexion 5/5 5/5   Wrist Flexion 5/5 5/5 Ankle Plantar Flexion 5/5 5/5   Interossei 5/5 5/5      APB 5/5 5/5        Reflexes   RJ BJ KJ AJ Plantars Hoffman's   Right 2+ 2+ 2+ 2+ Downgoing Not present   Left 2+ 2+ 2+ 2+ Downgoing Not present     Sensory: Sensory exam in the upper and lower extremities is normal except as noted: decreased to light touch L dorsal hand  Normal color, texture and turgor without significant lesions or rashes  Diabetes Monitors:  Patient not a diabetic.    Labs:    I have reviewed all lab results.        Independent Interpretation of images or specimens:  CT brain image grid 11/28/15: No stroke, no hemorrhage, no hydrocephalus.     CTSP pending in the ED    Assessment/Plan:  Gloria Nielsen is a 40 y.o. woman with history of  migraines since childhood and PNES who presented to the ED as a stroke page for headache and left sided numbness that started at 2145 yesterday evening.    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 Her headaches have worsened during the past month and now having daily migrainous headaches and has had some facial numbness symptoms with them, headache yesterday evening a bit worse and had new left sided numbness which has now improved. Suspect patient's migraines have worsened, but she does report a throbbing/pulsatile component which is a bit new and her headaches are worse than before (unclear why). Needs evaluated for vascular causes of headache aside from migraine (aneurysm) versus intracranial process (mass), etc. Low suspicion for stroke as cause of symptoms  -Ordered CTSP in the ED to evaluate vasculature  -Admit to general neurology service Dr. Rulon Abide  -IV HA management ordered in the ED - benadryl, magnesium, compazine  -MRI brain w/ and w/o contrast  -Neuro checks  -Vital signs  -Continue home elavil, Cymbalta  -Might benefit from frovatriptan due to migraines worsening with menses  -Avoid migraine triggers (smells, stressors, etc.)  -Needs better sleep, might benefit from sleep evaluation as outpatient (her sleep sounds like it is terrible)    Asthma  -Home albuterol  -Home zyrtec    Diet  -Regular    DNR Status this admission:  Full Code  Palliative/Supportive Care consulted?  no  Hospice Consulted?  Not applicable    Current Comorbid Conditions - Neurology H&P  Brain Compression:  no  Obstructive Hydrocephalus:  no  Coma (GCS less than 8): Coma -Not applicable  Stroke: Not applicable  Cerebral Edema:  no  Encephalopathy:   no  Encephalitis-Not applicable  Seizure-Not applicable   Respiratory Failure/Other-Not applicable  Coagulopathy Not applicable    DVT/PE Prophylaxis: Enoxaparin    Enis Gash, MD  11/29/2015, 06:31     CTSP obtained and unremarkable on my review, no perfusion defects either    Enis Gash, MD  11/29/2015, 07:37                                                   I saw and examined the patient.  I reviewed the resident's note.  I agree with the findings and plan of care as documented in the resident's note.  Any exceptions/additions are edited/noted.    Jamse Arn, MD

## 2015-11-29 NOTE — ED Nurses Note (Signed)
Admitting service paged for lab result.

## 2015-11-29 NOTE — ED Nurses Note (Signed)
Pt and family updated on admission status.

## 2015-11-29 NOTE — ED Nurses Note (Signed)
Patient resting at this time, will continue to monitor.

## 2015-11-30 LAB — CD4/CD8
CD3 # (T CELL): 1484 {cells}/uL (ref 892–2436)
CD3 % (T CELL): 84 %
CD4 # (HELPER T CELL): 1130 {cells}/uL (ref 382–1614)
CD4 % (HELPER T CELL): 64 %
CD4:CD8 RATIO: 3.3 (ref 1.0–3.4)
CD8 # (SUPPRESSOR T CELL): 339 {cells}/uL (ref 157–813)
CD8 % (SUPPRESSOR T CELL): 19 %

## 2015-11-30 LAB — PROCALCITONIN
PROCALCITONIN: <0.05 ng/mL (ref <0.50)
PROCALCITONIN: <0.05 ng/mL (ref <0.50)

## 2015-11-30 LAB — BASIC METABOLIC PANEL
ANION GAP: 5 mmol/L (ref 4–13)
BUN/CREA RATIO: 13 (ref 6–22)
BUN: 9 mg/dL (ref 8–25)
CALCIUM: 8.1 mg/dL — ABNORMAL LOW (ref 8.5–10.2)
CHLORIDE: 108 mmol/L (ref 96–111)
CO2 TOTAL: 25 mmol/L (ref 22–32)
CREATININE: 0.71 mg/dL (ref 0.49–1.10)
ESTIMATED GFR: >59 mL/min/1.73mˆ2 (ref >59)
GLUCOSE: 84 mg/dL (ref 65–139)
GLUCOSE: 84 mg/dL (ref 65–139)
POTASSIUM: 3.9 mmol/L (ref 3.5–5.1)
SODIUM: 138 mmol/L (ref 136–145)

## 2015-11-30 LAB — CBC WITH DIFF
BASOPHIL #: 0.04 x10ˆ3/uL (ref 0.00–0.20)
BASOPHIL %: 1 %
EOSINOPHIL #: 0.24 x10ˆ3/uL (ref 0.00–0.50)
EOSINOPHIL %: 4 %
EOSINOPHIL %: 4 %
HCT: 40.3 % (ref 33.5–45.2)
HCT: 40.3 % (ref 33.5–45.2)
HGB: 13.9 g/dL (ref 11.2–15.2)
LYMPHOCYTE #: 1.76 x10ˆ3/uL (ref 1.00–4.80)
LYMPHOCYTE %: 30 %
MCH: 31.5 pg (ref 27.4–33.0)
MCHC: 34.4 g/dL (ref 32.5–35.8)
MCV: 91.5 fL (ref 78.0–100.0)
MONOCYTE #: 0.49 x10ˆ3/uL (ref 0.30–1.00)
MONOCYTE %: 8 %
MPV: 10.5 fL (ref 7.5–11.5)
NEUTROPHIL #: 3.4 x10ˆ3/uL (ref 1.50–7.70)
NEUTROPHIL %: 57 %
PLATELETS: 158 x10ˆ3/uL (ref 140–450)
RBC: 4.4 x10ˆ6/uL (ref 3.63–4.92)
RDW: 13.4 % (ref 12.0–15.0)
WBC: 5.9 x10ˆ3/uL (ref 3.5–11.0)

## 2015-11-30 LAB — PHOSPHORUS: PHOSPHORUS: 3.1 mg/dL (ref 2.4–4.7)

## 2015-11-30 LAB — MAGNESIUM: MAGNESIUM: 2.5 mg/dL (ref 1.6–2.5)

## 2015-11-30 MED ORDER — METHYLPREDNISOLONE 4 MG TABLETS IN A DOSE PACK
ORAL_TABLET | ORAL | 0 refills | Status: DC
Start: 2015-11-30 — End: 2016-01-26

## 2015-11-30 MED ORDER — PROCHLORPERAZINE MALEATE 10 MG TABLET
10.00 mg | ORAL_TABLET | Freq: Four times a day (QID) | ORAL | 1 refills | Status: DC | PRN
Start: 2015-11-30 — End: 2016-12-13

## 2015-11-30 MED ORDER — KETOROLAC 30 MG/ML (1 ML) INJECTION SOLUTION
30.0000 mg | Freq: Three times a day (TID) | INTRAMUSCULAR | Status: DC
Start: 2015-11-30 — End: 2015-11-30

## 2015-11-30 MED ORDER — TIZANIDINE 4 MG TABLET
ORAL_TABLET | ORAL | 1 refills | Status: DC
Start: 2015-11-30 — End: 2016-08-16

## 2015-11-30 NOTE — Care Management Notes (Signed)
[facility] Management Initial Evaluation    Patient Name: Gloria Nielsen  Date of Birth: May 20, 1975  Sex: female  Date/Time of Admission: 11/29/2015  5:54 AM  Room/Bed: 520/A  Payor: [employer] PEIA / Plan: PEIA/HEALTHSMART / Product Type: Non Managed Care /   PCP: Darnelle Going, MD    Pharmacy Info:   Preferred Register    [address] [place] [address]    Phone: [phone] Fax: [phone]    Pharmacy hours not known        Emergency Contact Info:   Extended Emergency Contact Information  Primary Emergency Contact: Franco Nones  Address: [address] [place], [place] [address] [place] Phone: [phone]  Mobile Phone: [phone]  Relation: Husband    History:   Gloria Nielsen is a 40 y.o., female, admitted for worsening headaches    Height/Weight: 160 cm ('5\' 3"' ) / 86.7 kg (191 lb 2.2 oz)     LOS: 1 day   Admitting Diagnosis: Worsening headaches [R51]  Worsening headaches [R51]    Assessment:      11/30/15 1308   Assessment Details   Assessment Type Admission   Date of Care Management Update 11/30/15   Date of Next DCP Update 12/01/15   Readmission   Is this a readmission? No   Care Management Plan   Discharge Planning Status initial meeting   Projected Discharge Date 11/30/15   CM will evaluate for rehabilitation potential yes   Patient choice offered to patient/family no   Form for patient choice reviewed/signed and on chart no   Patient aware of possible cost for ambulance transport?  No   Discharge Needs Assessment   Equipment Currently Used at Home none   Equipment Needed After Discharge none   Discharge Facility/Level Of Care Needs Home (Patient/Family Member/other)(code 1)   Transportation Available car;family or friend will provide   Referral Information   Admission Type observation   Address Verified verified-no changes   Arrived From other (see comments)  (ED)   Insurance Verified verified-no change   Observation Form    Observation Form Given Non Medicare OBS form given   Non Medicare OBS form given to Genice Rouge   Non Medicare OBS form date of delivery 11/30/15   Non Medicare OBS form time of delivery 1116   ADVANCE DIRECTIVES   Does the Patient have an Advance Directive? No, Information Offered and Refused   Patient Requests Assistance in Having Advance Directive Notarized. N/A   LAY CAREGIVER   Appointed Lay Caregiver? I Decline   Employment/Financial   Patient has Prescription Coverage?  Yes       Name of Insurance Coverage for Medications PEIA   Financial Concerns none   Living Environment   Lives With spouse;child(ren), dependent   Living Arrangements mobile home   Able to Return to Prior Arrangements yes   Home Safety   Home Assessment: No Problems Identified   Home Accessibility bed and bath on same level;no concerns;stairs to enter home   Legal Issues   Do you have a court appointed guardian/conservator? No   Living Environment   Number of Stairs to Enter Home 4     Pt admitted with worsening headaches, left sided numbness resolved, MRI brain read pending, dc to home today.     Discharge Plan:  Home (Patient/Family Member/other) (code 1)  MSW met with pt and husband Suezanne Jacquet and son at bedside. Pt is independent at home, denies any HH or DME. Pt has PCP, pharmacy and script coverage verified. Refused information on MPOA/LW. MSW explained the lay caregiver pt declined. OBS letter, no concerns at this time. MSW explained the role of CM updated the pt's white board. Plan for discharge to home today no CM needs.    The patient will continue to be evaluated for developing discharge needs.     Case Manager: Kendall Flack  Phone: [phone]

## 2015-11-30 NOTE — Care Plan (Signed)
Problem: Health Knowledge, Opportunity to Enhance (Adult,Obstetrics,Pediatric)  Goal: Identify Related Risk Factors and Signs and Symptoms  Related risk factors and signs and symptoms are identified upon initiation of Human Response Clinical Practice Guideline (CPG).   Outcome: Ongoing (see interventions/notes)  Goal: Knowledgeable about Health Subject/Topic  Patient will demonstrate the desired outcomes by discharge/transition of care.   Outcome: Ongoing (see interventions/notes)    Comments:   Pt. Calm, alert, cooperative with assessment. Pt. Denies pain at this time. Neuro check WDL. Possible discharge today after neuro rounds. Verbal orders received from Rise PatienceAman Dabir, MD to leave PIV out unless otherwise told by service.

## 2015-11-30 NOTE — Care Management Notes (Signed)
Utilization Review Determination    SECTION I  Reason for Physician Advisor Referral: Does not meet inpatient criteria. 11/29/15    Current MERLIN MD Order: inpt    Utilization Review Findings: Patient meets observation status.  Utilization Review MD: Dr. Silvio ClaymanKaren Clark    Based on clinical information available to me as per above and in chart, the patient's status should be: Observation    Ola SpurrKristie Jean Morris-Metts, CLINICAL CARE COORDINATOR 11/30/2015, 09:29  -------------------------------------------------------------------------------------------------------------------  1.  I have reviewed this case with the patient's treating physician Dabir, and the MD agrees with this change in status.  They are aware of the referral to the Utilization Review Committee.    2.  The patient will be notified by DCP Carrie on 11/30/2015 of changes in level of care.  Please refer to the education documentation and/or Allscripts for specific time of delivery.    Ola SpurrKristie Jean Morris-Metts, CLINICAL CARE COORDINATOR 11/30/2015, 09:29  (Patient notification is required only if the status is changed while an Inpatient to Observation Status)  -------------------------------------------------------------------------------------------------------------------

## 2015-11-30 NOTE — Discharge Summary (Signed)
DISCHARGE SUMMARY      PATIENT NAME:  Gloria Nielsen,Gloria Nielsen  MRN:  [id number]  DOB:  11/15/1975    ADMISSION DATE:  11/29/2015  DISCHARGE DATE:  11/30/2015    ATTENDING PHYSICIAN: Georges MouseSULTAN, Trell Secrist, MD  SERVICE: Neurology 1  PRIMARY CARE PHYSICIAN: Lezlie OctaveAmy W Dowdy, MD    Reason for Admission     Diagnosis    Worsening headaches [[id number]][[id number]]    Worsening headaches [[id number]][[id number]]          DISCHARGE DIAGNOSIS: Headache    Principal Problem:  Headache    Active Hospital Problems    Diagnosis Date Noted    Worsening headaches 11/29/2015      Resolved Hospital Problems    Diagnosis    No resolved problems to display.     There are no active non-hospital problems to display for this patient.     Allergies   Allergen Reactions    Allegra [Fexofenadine] Rash    Sulfa (Sulfonamides) Rash    Tegretol [Carbamazepine] Rash        DISCHARGE MEDICATIONS:     Current Discharge Medication List      START taking these medications.       Details    Methylprednisolone 4 mg Tablets, Dose Pack   Commonly known as:  MEDROL DOSEPACK    Take as instructed.   Qty:  21 Tab   Refills:  0       prochlorperazine 10 mg Tablet   Commonly known as:  COMPAZINE    10 mg, Oral, 4x/day PRN   Qty:  30 Tab   Refills:  1       tiZANidine 4 mg Tablet   Commonly known as:  ZANAFLEX    Take 0.5-1 tab every 8 hours as needed for severe headache.   Qty:  30 Tab   Refills:  1         CONTINUE these medications - NO CHANGES were made during your visit.       Details    albuterol sulfate 90 mcg/actuation HFA Aerosol Inhaler   Commonly known as:  PROVENTIL or VENTOLIN or PROAIR    1-2 Puffs, Inhalation, Q6H PRN   Refills:  0       amitriptyline 50 mg Tablet   Commonly known as:  ELAVIL    50 mg, Oral, NIGHTLY   Refills:  0       cetirizine 10 mg Tablet   Commonly known as:  ZYRTEC    10 mg, Oral, Daily   Refills:  0       DULoxetine 60 mg Capsule, Delayed Release(E.C.)   Commonly known as:  CYMBALTA DR    60 mg, Oral, Daily   Refills:  0       MAXALT 10 mg Tablet   Generic drug:   Rizatriptan    10 mg, Oral, Once PRN, May repeat in 2 hours if    Refills:  0           Discharge med list refreshed?  YES        DISCHARGE INSTRUCTIONS:  Follow-up Information     Follow up with Lezlie Octaveowdy, Amy W, MD In 1 week.    Specialty:  EXTERNAL    Contact information:    [address]  [place] [id number]  [phone]          Follow up with Neurology Clinic, [facility] .    Specialty:  Neurology    Contact information:  [address]  [place] [id number]  [phone]    Additional information:    For driving directions to the Neurology Clinic, located within the [facility], [place],  please call [phone]-[phone] or you may visit our website at [email or web address]*Valet parking is available to patients at [facility] outpatient clinics for free and tipping is not required.*Visitors to our main campus will Radio producernotice construction as we are expanding to better serve you. We apologize for any inconvenience this may cause and appreciate your patience.          DISCHARGE INSTRUCTION - MISC   Please follow up with your primary care physician in 7-10 days to discuss health maintenance and headache medications.     SCHEDULE FOLLOW-UP NEUROLOGY - PHYSICIAN OFFICE CENTER   Follow-up in: 8 WEEKS    Reason for visit: HOSPITAL DISCHARGE    Follow-up reason: Headache    Provider: Dr. Elder Cyphersabir or first available             REASON FOR HOSPITALIZATION AND HOSPITAL COURSE:  This is a [age] y.o., female with a past medical history of migraines since childhood and PNES who presented to the ED as a stroke page and transfer [facility] on Tuesday morning (11/30/15) for headache and left-sided numbness that started the previous night. Stroke page was downgraded and CT stroke protocol was unremarkable. Due to patient's headache waking her from sleep, she was evaluated for vascular and intracranial causes of headache other than migraine. MRI brain showed no acute  intracranial process. Patient received IV headache management with benadryl, magnesium, and compazine. She was continued on home Elavil and Cymbalta - no dose adjustments were made, as patient said her primary care physician was in the process of increasing. Patient was educated on avoidance of migraine triggers and importance of good sleep habits. Prior to discharge, patient reported that her headache was improved and she was no longer having any numbness. Patient will follow up with Neurology in 8 weeks for her headaches. Of note, while in the ED patient received HIV screening. HIV-1 was negative, but HIV-2 was indeterminate. CD4/CD8 studies negative. She will need to follow-up with her PCP in 7-10 days to discuss results.         CONDITION ON DISCHARGE:  A. Ambulation: Full ambulation  B. Self-care Ability: Complete  C. Cognitive Status Alert and Oriented x 3  D. DNR status at discharge: Full Code    Advance Directive Information       Most Recent Value    Does the Patient have an Advance Directive? No, Information Offered and Refused          DISCHARGE DISPOSITION:  Home discharge      NEUROLOGY RISK FACTORS:  -None of the following conditions apply        Lurena NidaAlyssa C Fazi, MD  11/30/15, 11:29  [facility]  Department of Anesthesiology  PGY1          Copies sent to Care Team       Relationship Specialty Notifications Start End    Lezlie Octaveowdy, Amy W, MD PCP - General EXTERNAL  11/30/15     Phone: [phone] Fax: [phone]         [address] [place] [id number]          Referring providers can utilize [email or web address] to access their referred [facility] patient's information.

## 2015-11-30 NOTE — Nurses Notes (Signed)
Pt. Being discharged to home with family. AVS reviewed with pt, and copy provided. Pt. Verbalized understanding of discharge instructions. Signed scripts provided to pt. Telemetry removed and returned per policy. No PIV present. All belongings with pt, and belongings sheet signed by pt. Wheelchair assist offered to pt, but refused. Pt. To ambulate off unit with family when ready.

## 2015-11-30 NOTE — Progress Notes (Signed)
[facility]  Neurology Progress Note      Gloria AsalWeikle,Gloria, 40 y.o. female  Date of Admission:  11/29/2015  Date of service: 11/30/2015  Date of Birth:  01/09/1976      Chief Complaint: Headache, left-sided numbness, left-sided weakness  Pt's condition today: improving    Subjective: Patient doing well this morning. Denies headache, pain, numbness. Hopeful for discharge today.     Vital Signs:  Temp (24hrs) Max:36.8 C (98.2 F)      Systolic (24hrs), Avg:105 , Min:90 , Max:117     Diastolic (24hrs), Avg:64, Min:45, Max:74    Temp  Avg: 36.6 C (97.9 F)  Min: 36.4 C (97.5 F)  Max: 36.8 C (98.2 F)  Pulse  Avg: 98.5  Min: 67  Max: 162  Resp  Avg: 17.3  Min: 15  Max: 18  SpO2  Avg: 96.4 %  Min: 94 %  Max: 99 %  MAP (Non-Invasive)  Avg: 69 mmHG  Min: 69 mmHG  Max: 69 mmHG  Pain Score (Numeric, Faces): 0    Today's Physical Exam:  General:alert  Mental status:Alert and oriented x 3  Memory: Registration, Recall, and Following of commands is normal  Attention: Attention and Concentration are normal  Knowledge: Good  Language and Speech: Normal and Normal  Cranial nerves: Cranial nerves 2-12 are normal  Muscle tone: WNL  Motor strength:  Motor strength is normal throughout.  Sensory: Sensory exam in the upper and lower extremities is normal  Gait: Normal  Coordination: Coordination is normal without tremor  Reflexes: Reflexes are 2/2 throughout    Current Medications:    Current Facility-Administered Medications:  acetaminophen (TYLENOL) tablet 650 mg Oral Q4H PRN   amitriptyline (ELAVIL) tablet 50 mg Oral NIGHTLY   cetirizine (ZYRTEC) tablet 10 mg Oral Daily   diphenhydrAMINE (BENADRYL) 50 mg/mL injection 25 mg Intravenous Q8H   DULoxetine (CYMBALTA) delayed release capsule 60 mg Oral Daily   enoxaparin PF (LOVENOX) 40 mg/0.4 mL SubQ injection 40 mg Subcutaneous Q24H   magnesium sulfate 1 G in D5W 100 mL premix IVPB 1 g Intravenous Q8H   NS premix infusion  Intravenous Continuous   prochlorperazine (COMPAZINE) 5  mg/mL injection 10 mg Intravenous Q8H   tiZANidine (ZANAFLEX) tablet 4 mg Oral 3x/day PRN       I/O:  I/O last 24 hours:      Intake/Output Summary (Last 24 hours) at 11/30/15 0915  Last data filed at 11/30/15 0400   Gross per 24 hour   Intake             1740 ml   Output              300 ml   Net             1440 ml     I/O current shift:       Labs  Please indicate ordered or reviewed)  Reviewed:   Lab Results for Last 24 Hours:    Results for orders placed or performed during the hospital encounter of 11/29/15 (from the past 24 hour(s))   PHOSPHORUS   Result Value Ref Range    PHOSPHORUS 3.1 2.4 - 4.7 mg/dL   MAGNESIUM   Result Value Ref Range    MAGNESIUM 2.5 1.6 - 2.5 mg/dL   BASIC METABOLIC PANEL   Result Value Ref Range    SODIUM 138 136 - 145 mmol/L    POTASSIUM 3.9 3.5 - 5.1 mmol/L    CHLORIDE  108 96 - 111 mmol/L    CO2 TOTAL 25 22 - 32 mmol/L    ANION GAP 5 4 - 13 mmol/L    CALCIUM 8.1 (L) 8.5 - 10.2 mg/dL    GLUCOSE 84 65 - 413139 mg/dL    BUN 9 8 - 25 mg/dL    CREATININE 2.440.71 0.100.49 - 1.10 mg/dL    BUN/CREA RATIO 13 6 - 22    ESTIMATED GFR >59 >59 mL/min/1.5373m2   CBC WITH DIFF   Result Value Ref Range    WBC 5.9 3.5 - 11.0 x103/uL    RBC 4.40 3.63 - 4.92 x106/uL    HGB 13.9 11.2 - 15.2 g/dL    HCT 27.240.3 53.633.5 - 64.445.2 %    MCV 91.5 78.0 - 100.0 fL    MCH 31.5 27.4 - 33.0 pg    MCHC 34.4 32.5 - 35.8 g/dL    RDW 03.413.4 74.212.0 - 59.515.0 %    PLATELETS 158 140 - 450 x103/uL    MPV 10.5 7.5 - 11.5 fL    NEUTROPHIL % 57 %    LYMPHOCYTE % 30 %    MONOCYTE % 8 %    EOSINOPHIL % 4 %    BASOPHIL % 1 %    NEUTROPHIL # 3.40 1.50 - 7.70 x103/uL    LYMPHOCYTE # 1.76 1.00 - 4.80 x103/uL    MONOCYTE # 0.49 0.30 - 1.00 x103/uL    EOSINOPHIL # 0.24 0.00 - 0.50 x103/uL    BASOPHIL # 0.04 0.00 - 0.20 x103/uL       Independent Interpretation of images or specimens:  MRI Brain w/wo (11/29/15): Official read pending. No acute intracranial process noted on our review    Patient/ Family Discussion: Discussed plan with  patient    Assessment/Plan:  Active Hospital Problems    Diagnosis    Worsening headaches     Gloria Nielsen is a 40 y.o. woman with history of migraines since childhood and PNES who presented to the ED as a stroke page Tuesday morning for headache and left-sided numbness that started at 2145 Monday night.    Episodic migraine with aura  Possible left occipital neuralgia  Pulsatile headache, new  Left sided numbness, almost resolved  - Strong history of migraines for many years and family history of migraines in mother (may have had catemenial migraines - better after hysterectomy).   - Headaches have worsened during the past month and now having daily migrainous headaches and has had some facial numbness symptoms   - Ordered CTSP in the ED to evaluate vasculature - No acute intracranial process  - MRI Brain w/wo (11/29/15): Official read pending. No acute intracranial process noted on our review  - IV HA management - benadryl, magnesium, compazine  - Continue home elavil, Cymbalta  - Might benefit from frovatriptan due to migraines worsening with menses  - Avoid migraine triggers (smells, stressors, etc.)  - Patient reports poor sleep - may benefit from sleep evaluation outpatient    Asthma  -Home albuterol  -Home zyrtec    Indeterminate HIV-2 screening  - CD4/CD8 pending  - Will need to follow up with PCP in 7-10 days. We will offer ID consult outpatient.     Labs/Diagnostic studies ordered: none    DVT/PE Prophylaxis: Enoxaparin    Disposition: Hopefully discharge today. Follow up with PCP or ID outpatient.     Lurena NidaAlyssa C Fazi, MD  11/30/15, 09:15  [facility]  Department of Anesthesiology  PGY1  I saw and examined the patient.  I reviewed the resident's note.  I agree with the findings and plan of care as documented in the resident's note.  Any exceptions/additions are edited/noted.    Seward Grater, MD

## 2015-11-30 NOTE — Care Plan (Signed)
Problem: Patient Care Overview (Adult,OB)  Goal: Plan of Care Review(Adult,OB)  The patient and/or their representative will communicate an understanding of their plan of care  Outcome: Ongoing (see interventions/notes)  Pt admitted with worsening headaches, left sided numbness resolved, MRI brain read pending, dc to home today.     Discharge Plan:  Home (Patient/Family Member/other) (code 1)  MSW met with pt and husband Suezanne Jacquet and son at bedside. Pt is independent at home, denies any HH or DME. Pt has PCP, pharmacy and script coverage verified. Refused information on MPOA/LW. MSW explained the lay caregiver pt declined. OBS letter, no concerns at this time. MSW explained the role of CM updated the pt's white board. Plan for discharge to home today no CM needs.     The patient will continue to be evaluated for developing discharge needs.

## 2015-12-04 LAB — ADULT ROUTINE BLOOD CULTURE, SET OF 2 BOTTLES (BACTERIA AND YEAST): BLOOD CULTURE, ROUTINE: NO GROWTH

## 2016-01-11 ENCOUNTER — Telehealth (INDEPENDENT_AMBULATORY_CARE_PROVIDER_SITE_OTHER): Payer: Self-pay | Admitting: Family Medicine

## 2016-01-26 ENCOUNTER — Encounter (INDEPENDENT_AMBULATORY_CARE_PROVIDER_SITE_OTHER): Payer: Self-pay

## 2016-01-26 ENCOUNTER — Ambulatory Visit: Payer: No Typology Code available for payment source | Attending: Neurology

## 2016-01-26 VITALS — BP 106/70 | HR 86 | Ht 63.0 in | Wt 194.0 lb

## 2016-01-26 DIAGNOSIS — Z79899 Other long term (current) drug therapy: Secondary | ICD-10-CM | POA: Insufficient documentation

## 2016-01-26 DIAGNOSIS — G43009 Migraine without aura, not intractable, without status migrainosus: Secondary | ICD-10-CM | POA: Insufficient documentation

## 2016-01-26 DIAGNOSIS — G43709 Chronic migraine without aura, not intractable, without status migrainosus: Secondary | ICD-10-CM | POA: Insufficient documentation

## 2016-01-26 DIAGNOSIS — IMO0002 Reserved for concepts with insufficient information to code with codable children: Secondary | ICD-10-CM

## 2016-01-26 DIAGNOSIS — G43109 Migraine with aura, not intractable, without status migrainosus: Secondary | ICD-10-CM | POA: Insufficient documentation

## 2016-01-26 DIAGNOSIS — M5481 Occipital neuralgia: Secondary | ICD-10-CM | POA: Insufficient documentation

## 2016-01-26 MED ORDER — ELETRIPTAN 40 MG TABLET
ORAL_TABLET | ORAL | 3 refills | Status: DC
Start: 2016-01-26 — End: 2016-08-16

## 2016-01-26 MED ORDER — FAMOTIDINE 40 MG TABLET
40.00 mg | ORAL_TABLET | Freq: Every day | ORAL | 3 refills | Status: AC | PRN
Start: 2016-01-26 — End: 2016-02-02

## 2016-01-26 MED ORDER — KETOROLAC 30 MG/ML (1 ML) INJECTION SOLUTION
30.0000 mg | Freq: Once | INTRAMUSCULAR | 0 refills | Status: AC
Start: 2016-01-26 — End: 2016-01-26

## 2016-01-26 NOTE — Patient Instructions (Signed)
Patient

## 2016-01-26 NOTE — Progress Notes (Signed)
[facility]  [place] Department of Neurology                                       Operated by [facility]  Return Outpatient Note      Date:  01/26/2016  Patient Name: Gloria Nielsen  MRN: [id number]  Age:  40 y.o.  Referring Physician:   Georges Mouse, MD  [address]  [address]  [place], [place] [address]    CC: Migraine      History obtained from:  Patient and Husband     History of Present Illness:   Gloria Nielsen is a 40 y.o. yo, right / handed female who presents with headaches  for evaluation of headaches.?  Collateral history was obtained from patient's husband. Patient initially admit to [facility] for HA, during that time she had daily headache and was started on, Zofran, compazine,  Elavil and Cymbalta was increased. Since then her headaches has decrease. She she has been taking her HA cocktail along with her maxalt when she feels her HA coming on. It works 50% of the time. When it does not work she tried taking it again, this sometimes may or may not work 50% of the time. Patient states that her prior Relpax worked bettered the her current Maxalt.     Per husband, he states that the skin under the patient's eyes gets dark and that her speech gets slurred.     Headache Characteristics:   Headache days per month:  12; 6  of them are severe. Individual headaches typically last 1-2 days even w/treatment .  They are located  Occipital region, and when severe radiate towards the front of her head behind her eyes.   Overall severity: 4/10 when mild, 7/10 when severe.   Quality: Sharp, throbbing, and pressure    Associated features include: Photophobia, phonophobia,  nausea, vomiting, worse with activity. Vomiting does not improve the headache. Vision changes, I.e. Spots, and blurry.  reported. Occasional non-continuous   tinnitus.  Autonomic features: occasional nasal congestion, tearing, ocular injection  Aura: Sometimes has visual auras of seeing spots.   Headache triggers: 2-3  before menstrual period. Stress    Headache aggravated by: movement, lights, sound   Headache relieved by: Headache cocktail   Headache behavior: Tried to sleep it off in a dark room   Menstrual association: Yes   Association with time of day: no.  Association with position: No      Current Headache Treatments/ Medications: Naproxen, Elavil, Zofran, Compazine, Cymbalta, Maxalt, Zanaflex     Other Related Medications:     Past Headache/ Other Treatments/ Meds/ Adverse Events/ Relevant Allergies:   Abortive:  Imitrex, Relpax (on for 3 years, patient thinks it worked better)   Preventive:  3 nerve blocks (last time in April) Topamax (did not help, 6 months duration) , Propranolol (did not help, 3 months), Botox     Review of Systems:   Constitutional: No fever or chills. No weight loss or gain.   Eyes: No blurred vision or diplopia.   ENT: No hearing loss, no tinnitus, no ear pain or discharge. No sore throat. No sinus symptoms or nasal symptoms.   Neck: No swelling or swollen lymph nodes.   Respiratory: No cough, sputum, no dyspnea.   Cardiovascular: No chest pain.   Gastrointestinal: No nausea and vomiting. No diarrhea or constipation.   Genitourinary: No  urinary incontinence   Skin: No rashes, hives or worrisome skin lesions.   Neurological: 5/10 headache. No seizures, no focal persistent weakness/numbness.   Mental Health: No depression, sadness, appetite/ eating disorder, self injury.  Also please see the patient intake form for this visit.  All other systems reviewed and were negative.     Family History:   Mother: HA     Social History: nonsmoker, non drinker    PHYSICAL EXAM:    BP 106/70   Pulse 86   Ht 1.6 m (5\' 3" )   Wt 88 kg (194 lb 0.1 oz)   BMI 34.37 kg/m2    I. General Appearance/ Constitutional/ Summary: Normal development.  II. Eyes: Funduscopic Exam: Normal with regard to discs and vessels.   III. Cardiovascular: Carotids: Two plus without bruits. Temporal arteries normal without  tenderness or enlargement.   Heart: Normal S1/S2. No murmurs, gallops or rubs.  Peripheral vascular system: No swelling, edema, varicosities, ulcers, erythema or tenderness. Pulses present and symmetrical.  IV. Neurological: Mental Status: Normal with regard to orientation, recent and remote memory, attention, language and fund of information.   Cranial Nerves: Visual acuity/color vision intact. Pupils 4 mm and reactive. Extraocular movements full. Visual fields normal. No facial asymmetry or sensory loss. Hearing is intact. Oropharynx normal. Palate elevates well. Tongue is midline. Shoulder shrug is normal.  Sensory Testing: Normal to all modalities.   DTRs: Reflexes 2+ and symmetrical. Toes downgoing.   Coordination and Tone: Normal.   Gait/station: Normal to all modalities with normal Romberg, tandem and nonstressed gait. Muscle strength, bulk and tone normal throughout. No atrophy or fasciculations.   V. Musculoskeletal (Other):   Neck: Full ROM in all quadrants without spasm. Occipitonuchal tenderness. L > R. Mild tenderness when turning head to left.   TMJ: No tenderness, clicks or restricted ROM      Personal review of             Diabetes Monitors  A1C - Glucose - Lipids Microalbumin   No results for input(s): HA1C, GLUCOSEFAST, CHOLESTEROL, HDLCHOL, LDLCHOL, LDLCHOLDIR, TRIG in the last 16109 hours. No results for input(s): MICALBRNUR, MICALBCRERAT in the last 60454 hours.     Diabetic foot exam:   Both feet without edema or ulcerations. Pulses normal bilaterally. Sensation normal bilaterally                    Outside records:     Assessment/Plan:     ICD-10-CM    1. Migraine with sensory, visual and motor aura G43.109    2. Bilateral occipital neuralgia M54.81    3. Migraine without aura G43.009    4. Chronic migraine without aura G43.709    5. Chronic migraine with aura G43.709      Orders Placed This Encounter    eletriptan (RELPAX) 40 mg Oral Tablet    famotidine (PEPCID) 40 mg Oral  Tablet    ketorolac (TORADOL) 30 mg/mL (1 mL) Injection Solution     [name], 6 F, who is following up in clinic for chronic migraines both with and without sensory aura that is responsive to medication.     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   -Keep headache diary   -Informed the patient to take Naproxen prior menstrual period for 3-4 days along with Pepcid  -Prescribe Pepcid to take on days taking Naproxen, see above.   -Will give Toradol 30 mg IM shot in clinic for current headache   -RTC in 3 months, scheduled at the end of the day for possible occipital nerve block.     Kathyrn SheriffPhong Thanh Vu, MD 01/26/2016, 16:40      I saw and examined the patient.  I reviewed the resident's note.  I agree with the findings and plan of care as documented in the resident's note.  Any exceptions/additions are edited/noted.    Georgina PeerKristina Wiktoria Hemrick, MD

## 2016-05-03 ENCOUNTER — Encounter (INDEPENDENT_AMBULATORY_CARE_PROVIDER_SITE_OTHER): Payer: No Typology Code available for payment source

## 2016-05-10 ENCOUNTER — Encounter (INDEPENDENT_AMBULATORY_CARE_PROVIDER_SITE_OTHER): Payer: No Typology Code available for payment source

## 2016-08-07 ENCOUNTER — Ambulatory Visit (INDEPENDENT_AMBULATORY_CARE_PROVIDER_SITE_OTHER): Payer: Self-pay | Admitting: Neurology

## 2016-08-07 NOTE — Telephone Encounter (Signed)
Elitriptan has been denied by patient's insurance.  It is listed as non-preferred.  Must have documentation of tried and failure of Amerge and Maxalt.  Bonnee Quin, LPN  1/61/0960, 14:32

## 2016-08-16 ENCOUNTER — Ambulatory Visit: Payer: MEDICAID | Attending: Neurology

## 2016-08-16 ENCOUNTER — Encounter (INDEPENDENT_AMBULATORY_CARE_PROVIDER_SITE_OTHER): Payer: Self-pay

## 2016-08-16 VITALS — BP 114/78 | HR 80 | Ht 63.0 in | Wt 198.0 lb

## 2016-08-16 DIAGNOSIS — Z791 Long term (current) use of non-steroidal anti-inflammatories (NSAID): Secondary | ICD-10-CM | POA: Insufficient documentation

## 2016-08-16 DIAGNOSIS — M5481 Occipital neuralgia: Secondary | ICD-10-CM | POA: Insufficient documentation

## 2016-08-16 DIAGNOSIS — G43709 Chronic migraine without aura, not intractable, without status migrainosus: Secondary | ICD-10-CM

## 2016-08-16 DIAGNOSIS — IMO0002 Reserved for concepts with insufficient information to code with codable children: Secondary | ICD-10-CM

## 2016-08-16 DIAGNOSIS — G43909 Migraine, unspecified, not intractable, without status migrainosus: Secondary | ICD-10-CM | POA: Insufficient documentation

## 2016-08-16 DIAGNOSIS — G43009 Migraine without aura, not intractable, without status migrainosus: Secondary | ICD-10-CM

## 2016-08-16 DIAGNOSIS — R531 Weakness: Secondary | ICD-10-CM | POA: Insufficient documentation

## 2016-08-16 DIAGNOSIS — Z79899 Other long term (current) drug therapy: Secondary | ICD-10-CM | POA: Insufficient documentation

## 2016-08-16 DIAGNOSIS — G43109 Migraine with aura, not intractable, without status migrainosus: Secondary | ICD-10-CM

## 2016-08-16 MED ORDER — TIZANIDINE 4 MG TABLET
ORAL_TABLET | ORAL | 5 refills | Status: DC
Start: 2016-08-16 — End: 2016-12-13

## 2016-08-16 MED ORDER — DULOXETINE 30 MG CAPSULE,DELAYED RELEASE
90.0000 mg | DELAYED_RELEASE_CAPSULE | Freq: Every day | ORAL | 5 refills | Status: DC
Start: 2016-08-16 — End: 2016-09-05

## 2016-08-16 MED ORDER — AMITRIPTYLINE 75 MG TABLET
75.0000 mg | ORAL_TABLET | Freq: Every evening | ORAL | 5 refills | Status: DC
Start: 2016-08-16 — End: 2016-12-13

## 2016-08-16 MED ORDER — RIZATRIPTAN 10 MG DISINTEGRATING TABLET
10.0000 mg | ORAL_TABLET | Freq: Once | ORAL | 5 refills | Status: DC | PRN
Start: 2016-08-16 — End: 2016-12-13

## 2016-08-16 NOTE — Progress Notes (Signed)
OUTPATIENT HEADACHE CLINIC  [place] [facility]     08/16/2016    Name: ??Gloria Nielsen,Gloria Nielsen    DOB: ??04/01/1976    Primary Physician: Lezlie OctaveAmy W Dowdy, MD  ______________________________________    Chief Complaint: migraine w/wo visual, sensory and motor aura.    HPI: The patient is a 41 y.o. yo female, who presents for follow up of her headaches. Collateral history was obtained from patient's accompanying SO. Since then, headaches has increase in frequency over the last month and half. She is currently on Relpax, which helps 50% of the time, 2nd dose helps 50% of the time similar to prior Maxalt.   Still have rare sharp shooting pain 3 times a month over left occipital region. She is more concern about the headache/migraine then shooting pain.      Current Headache Treatments/ Medications: Naproxen PRN, amitriptyline 75 mg, Compazine 10 mg PRN, Cymbalta 90 mg, Eletriptan 40 mg , Zanaflex 4 mg PRN        Other Related Medications:     Past Headache/ Other Treatments/ Meds/ Adverse Events/ Relevant Allergies:   Abortive:  Imitrex, Relpax (on for 3 years, patient thinks it worked better)   Preventive:  3 nerve blocks (last time in April) Topamax (did not help, 6 months duration) ,Propranolol (did not help, 3 months), Botox (once)    Current Outpatient Prescriptions   Medication Sig   . albuterol sulfate (PROVENTIL OR VENTOLIN OR PROAIR) 90 mcg/actuation Inhalation HFA Aerosol Inhaler Take 1-2 Puffs by inhalation Every 6 hours as needed   . amitriptyline (ELAVIL) 50 mg Oral Tablet Take 75 mg by mouth Every night    . cetirizine (ZYRTEC) 10 mg Oral Tablet Take 10 mg by mouth Once a day   . DULoxetine (CYMBALTA DR) 60 mg Oral Capsule, Delayed Release(E.C.) Take 90 mg by mouth Once a day    . naproxen (NAPROSYN) 500 mg Oral Tablet Take 500 mg by mouth Twice daily with food   . prochlorperazine (COMPAZINE) 10 mg Oral Tablet Take 1 Tab (10 mg total) by mouth Four times a day as needed for nausea/vomiting   . tiZANidine  (ZANAFLEX) 4 mg Oral Tablet Take 0.5-1 tab every 8 hours as needed for severe headache.       Headache Characteristics/Pattern (Days/ Month):   Current: Headache days per month: 30 ; 10 of them are severe  Previous: Headache days per month:  12; 6  of them are severe. Individual headaches typically last 1-2 days even w/treatment .      Prior Clinical Notes/ Record Summaries:   Headache Characteristics: ?  ??Headache days per month:  12; 6  of them are severe. Individual headaches typically last 1-2 days even w/treatment .  They are located  Occipital region, and when severe radiate towards the front of her head behind her eyes.   Overall severity: 4/10 when mild, 7/10 when severe.   Quality: Sharp, throbbing, and pressure    Associated features include: Photophobia, phonophobia,  nausea, vomiting, worse with activity. Vomiting does not improve the headache. Vision changes, I.e. Spots, and blurry.  reported. Occasional non-continuous   tinnitus.  Autonomic features?: occasional nasal congestion, tearing, ocular injection  ?Aura: Sometimes has visual auras of seeing spots.   Headache triggers: 2-3 before menstrual period. Stress    Headache aggravated by: movement, lights, sound   Headache relieved by: Headache cocktail   Headache behavior: Tried to sleep it off in a dark room   Menstrual association: Yes  Association with time of day: no.  Association with position: No    ??Other problems??:     ROS: No fever/ chills/ rash/ lymphadenopathy. No chest pain, SOB, GI or GU complaints. Except as per the HPI, all other systems were reviewed and are negative.     Physical Examination:   ??Blood pressure 114/78, pulse 80, height 1.6 m (5\' 3" ), weight 89.8 kg (197 lb 15.6 oz).  Gen: Normal development.   Summary Findings:  Mental Status: Awake, alert and oriented. Normal speech, language, attention, memory and fund of information.  Head/ Face/ Scalp:  Neck: ROM full, no spasm. Carotids 2+, no bruits. Occipital  nuchal tenderness over left side.  Fundoscopic exam: No papilledema   Cranial Nerve Findings: None  Motor, Sensory and Coordination Testing: Normal.  Reflex Changes: None  Gait and Function: Normal.     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      -Will switch back to Maxalt 10 mg, if failed will consider Naratriptan  -Continue duloxetine 90 mg and amitriptyline to 75 mg (unable to tolerate 100 mg) for migraine prophylaxis.   -Continue  Zanaflex PRN for severe headaches, no more then twice a week.   -Informed the patient to take Naproxen prior menstrual period for 3-4 days along with Pepcid  -Left occipital nerve block today  -RTC 4 months  -Of note, patient tried Botox once previously, however had some neck weakness. For future reference if patient would like to tried Botox again, avoid neck weakness by injecting shots more superiorly.     Gloria Sheriff, MD  08/16/2016, 14:47      I saw and examined the patient.  I reviewed the resident's note.  I agree with the findings and plan of care as documented in the resident's note.  Any exceptions/additions are edited/noted.    Georgina Peer, MD

## 2016-08-16 NOTE — Progress Notes (Signed)
Procedure: Left Occipital Nerve Block.   The rationale, risks (including the risk of osteonecrosis if steroids are used), benefits, and alternatives of occipital nerve injections for headaches were reviewed with the patient. All questions answered, consent was given, and patient decided to proceed with the injection. Prior to starting the procedure, all pertinent records were reviewed, the nature of the procedure was explained, and the following details were verified: correct patient identity, procedure to be performed.    I then performed left occipital nerve blocks today as follows: After cleaning the area, 2.5 mL of  0.5% bupivacaine was infiltrated in the area of the left greater occipital nerves and 2.5 mL in the area of the  left lesser occipital nerves at the superior nuchal line using a 30 gauge needle. A total of 5 mL of bupivacaine was injected.     A total of 4 mg dexamethasone was injected with the bupivacaine.     Following the injection, pressure was applied to each puncture site, and the patient was observed. There were no apparent complications. Following an appropriate amount of observation, the patient was dismissed from the Clinic in good condition under patient's own power    Kathyrn SheriffPhong Thanh Vu, MD 08/16/16, 15:36   [place] [facility]    I was present and supervised/observed the entire procedure.  Georgina PeerKristina Phyllis Abelson, MD

## 2016-08-20 ENCOUNTER — Encounter (INDEPENDENT_AMBULATORY_CARE_PROVIDER_SITE_OTHER): Payer: Self-pay | Admitting: Neurology

## 2016-08-20 NOTE — Nursing Note (Signed)
Prior Berkley Harveyauth has been submitted for Duloxetine 30 mg, 3 cap daily.  Bonnee QuinLisa R Shabree Tebbetts, LPN  1/6/10965/10/2016, 04:[id number]:26

## 2016-08-21 ENCOUNTER — Encounter (INDEPENDENT_AMBULATORY_CARE_PROVIDER_SITE_OTHER): Payer: Self-pay | Admitting: Neurology

## 2016-08-21 NOTE — Progress Notes (Signed)
Duloxetine has been denied by patient's insurance.  Form states that any dose exceeding 60 mg daily requires the physician tosubmit a letter of appeal.  The denial form has been placed in your mailbox.  Gloria QuinLisa R Novak, LPN  1/6/10965/11/2016, 04:5414:52

## 2016-09-05 MED ORDER — DULOXETINE 60 MG CAPSULE,DELAYED RELEASE
60.0000 mg | DELAYED_RELEASE_CAPSULE | Freq: Every day | ORAL | 3 refills | Status: DC
Start: 2016-09-05 — End: 2016-10-25

## 2016-09-05 NOTE — Progress Notes (Signed)
Duloxetine 90 mg not covered by insurance. Wrote for duloxetine 60 mg. I'd like to continue duloxetine as she noted some improvement in headache and mood while on it. If her headaches/mood worsen with a lower dose, then will try to get 90 mg again.

## 2016-09-21 ENCOUNTER — Ambulatory Visit (INDEPENDENT_AMBULATORY_CARE_PROVIDER_SITE_OTHER): Payer: Self-pay

## 2016-09-21 NOTE — Telephone Encounter (Signed)
Regarding: Gloria Nielsen  ----- Message from Lenore CordiaSarah Elizabeth Obrad sent at 09/20/2016  3:32 PM EDT -----  Gloria Nielsen      Pt called and would like to know if prior auth for DULoxetine (CYMBALTA DR) 60 mg Oral Capsule, Delayed Release(E.C.) has been received. Please call to advise. Thanks

## 2016-09-21 NOTE — Telephone Encounter (Signed)
Please see message from 5/8

## 2016-10-25 ENCOUNTER — Other Ambulatory Visit (INDEPENDENT_AMBULATORY_CARE_PROVIDER_SITE_OTHER): Payer: Self-pay

## 2016-10-25 ENCOUNTER — Ambulatory Visit (INDEPENDENT_AMBULATORY_CARE_PROVIDER_SITE_OTHER): Payer: Self-pay

## 2016-10-25 DIAGNOSIS — R51 Headache: Secondary | ICD-10-CM

## 2016-10-25 DIAGNOSIS — G43009 Migraine without aura, not intractable, without status migrainosus: Secondary | ICD-10-CM

## 2016-10-25 DIAGNOSIS — R519 Headache, unspecified: Secondary | ICD-10-CM

## 2016-10-25 DIAGNOSIS — IMO0002 Reserved for concepts with insufficient information to code with codable children: Secondary | ICD-10-CM

## 2016-10-25 DIAGNOSIS — G43109 Migraine with aura, not intractable, without status migrainosus: Secondary | ICD-10-CM

## 2016-10-25 DIAGNOSIS — G43709 Chronic migraine without aura, not intractable, without status migrainosus: Secondary | ICD-10-CM

## 2016-10-25 MED ORDER — DULOXETINE 60 MG CAPSULE,DELAYED RELEASE
60.0000 mg | DELAYED_RELEASE_CAPSULE | Freq: Every day | ORAL | 5 refills | Status: DC
Start: 2016-10-25 — End: 2016-12-13

## 2016-10-25 NOTE — Telephone Encounter (Signed)
Please advise 

## 2016-10-25 NOTE — Telephone Encounter (Signed)
Regarding: Gloria Nielsen - pt symptoms  ----- Message from Ulyess Blossomeresa Lynn Sylvester sent at 10/25/2016  9:35 AM EDT -----  Gloria Nielsen  Pt is f/u on this.  Her headaches have worsened and is asking if perhaps the med would need to be changed to something else.  She has been out of meds for 2 weeks.  Please advise pt.  Thanks,    ----- Message from Theresia LoSarah Elizabeth Obrad sent at 09/20/2016  3:32 PM EDT -----  Gloria Nielsen      Pt called and would like to know if prior auth for DULoxetine (CYMBALTA DR) 60 mg Oral Capsule, Delayed Release(E.C.) has been received. Please call to advise. Thanks

## 2016-12-13 ENCOUNTER — Encounter (INDEPENDENT_AMBULATORY_CARE_PROVIDER_SITE_OTHER): Payer: Self-pay

## 2016-12-13 ENCOUNTER — Ambulatory Visit: Payer: MEDICAID

## 2016-12-13 VITALS — BP 110/68 | HR 75 | Ht 65.2 in | Wt 198.0 lb

## 2016-12-13 DIAGNOSIS — G43709 Chronic migraine without aura, not intractable, without status migrainosus: Secondary | ICD-10-CM | POA: Insufficient documentation

## 2016-12-13 DIAGNOSIS — Z79899 Other long term (current) drug therapy: Secondary | ICD-10-CM | POA: Insufficient documentation

## 2016-12-13 DIAGNOSIS — IMO0002 Reserved for concepts with insufficient information to code with codable children: Secondary | ICD-10-CM

## 2016-12-13 DIAGNOSIS — M7918 Myalgia, other site: Secondary | ICD-10-CM

## 2016-12-13 DIAGNOSIS — M5481 Occipital neuralgia: Secondary | ICD-10-CM

## 2016-12-13 DIAGNOSIS — M791 Myalgia: Secondary | ICD-10-CM

## 2016-12-13 MED ORDER — TIZANIDINE 4 MG TABLET
ORAL_TABLET | ORAL | 5 refills | Status: AC
Start: 2016-12-13 — End: ?

## 2016-12-13 MED ORDER — DULOXETINE 60 MG CAPSULE,DELAYED RELEASE
60.0000 mg | DELAYED_RELEASE_CAPSULE | Freq: Every day | ORAL | 5 refills | Status: AC
Start: 2016-12-13 — End: ?

## 2016-12-13 MED ORDER — AMITRIPTYLINE 75 MG TABLET
75.0000 mg | ORAL_TABLET | Freq: Every evening | ORAL | 5 refills | Status: AC
Start: 2016-12-13 — End: ?

## 2016-12-13 MED ORDER — PROCHLORPERAZINE MALEATE 10 MG TABLET
10.0000 mg | ORAL_TABLET | Freq: Four times a day (QID) | ORAL | 5 refills | Status: AC | PRN
Start: 2016-12-13 — End: ?

## 2016-12-13 MED ORDER — RIZATRIPTAN 10 MG DISINTEGRATING TABLET
10.0000 mg | ORAL_TABLET | Freq: Once | ORAL | 5 refills | Status: AC | PRN
Start: 2016-12-13 — End: ?

## 2016-12-13 NOTE — Progress Notes (Signed)
OUTPATIENT HEADACHE CLINIC  [place] [facility]     12/13/2016    Name: ??Margarette AsalWeikle,Naryiah    DOB: ??04/04/1976    Primary Physician: Lezlie OctaveAmy W Dowdy, MD  ______________________________________    Chief Complaint: migraine w/wo visual, sensory and motor aura.    HPI: The patient is a 41 y.o. yo female, who presents for follow up of her headaches.  During her last visit, May 2018, she received bilateral occipital nerve block, its decrease her headache days from 30 to 7 with 3 being severe. She noticed wearing off of the occipital nerve block 3 months after procedure, and is now having daily headaches again  She is currently on Maxalt, which helps 50% of the time, 2nd dose helps 50% of the time which is similar to prior .     Continues to have sharp shooting pain 3 times a month over bilateral occipital region, whereas previously it was only the left side. She in interested in occipital nerve block today.       Current Headache Treatments/ Medications: Naproxen PRN, amitriptyline 75 mg, Compazine 10 mg PRN, Cymbalta 60 mg, Maxalt 10 mg , Zanaflex 4 mg PRN        Other Related Medications:     Past Headache/ Other Treatments/ Meds/ Adverse Events/ Relevant Allergies:   Abortive:  Imitrex, Relpax (on for 3 years, patient thinks it worked better)   Preventive:  3 nerve blocks (last time in May, helped a lot) Topamax (did not help, 6 months duration) ,Propranolol (did not help, 3 months), Botox (once)      Headache Characteristics/Pattern (Days/ Month):   Current: Headache days per month: 30 ; 10 of them are severe  Previous HA after nerve block: Headache days per month:  7; 3  of them are severe.       Current Outpatient Prescriptions   Medication Sig   . albuterol sulfate (PROVENTIL OR VENTOLIN OR PROAIR) 90 mcg/actuation Inhalation HFA Aerosol Inhaler Take 1-2 Puffs by inhalation Every 6 hours as needed   . amitriptyline (ELAVIL) 75 mg Oral Tablet Take 1 Tab (75 mg total) by mouth Every night   . cetirizine (ZYRTEC)  10 mg Oral Tablet Take 10 mg by mouth Once a day   . DULoxetine (CYMBALTA DR) 60 mg Oral Capsule, Delayed Release(E.C.) Take 1 Cap (60 mg total) by mouth Once a day   . naproxen (NAPROSYN) 500 mg Oral Tablet Take 500 mg by mouth Twice daily with food   . prochlorperazine (COMPAZINE) 10 mg Oral Tablet Take 1 Tab (10 mg total) by mouth Four times a day as needed for nausea/vomiting   . rizatriptan (MAXALT-MLT) 10 mg Oral Tablet, Rapid Dissolve Take 1 Tab (10 mg total) by mouth Once, as needed for up to 1 dose May repeat in 2 hours if   . tiZANidine (ZANAFLEX) 4 mg Oral Tablet Take 0.5-1 tab every 8 hours as needed for severe headache.   . [DISCONTINUED] amitriptyline (ELAVIL) 75 mg Oral Tablet Take 1 Tab (75 mg total) by mouth Every night   . [DISCONTINUED] DULoxetine (CYMBALTA DR) 60 mg Oral Capsule, Delayed Release(E.C.) Take 1 Cap (60 mg total) by mouth Once a day           Prior Clinical Notes/ Record Summaries:   Initial Headache Characteristics: ?  ??Headache days per month:  12; 6  of them are severe. Individual headaches typically last 1-2 days even w/treatment .  They are located left cccipital region, and when  severe radiate towards the front of her head behind her eyes.   Overall severity: 4/10 when mild, 7/10 when severe.   Quality: Sharp, throbbing, and pressure    Associated features include: Photophobia, phonophobia,  nausea, vomiting, worse with activity. Vomiting does not improve the headache. Vision changes, I.e. Spots, and blurry.  reported. Occasional non-continuous   tinnitus.  Autonomic features?: occasional nasal congestion, tearing, ocular injection  ?Aura: Sometimes has visual auras of seeing spots.   Headache triggers: 2-3 before menstrual period. Stress    Headache aggravated by: movement, lights, sound   Headache relieved by: Headache cocktail   Headache behavior: Tried to sleep it off in a dark room   Menstrual association: Yes   Association with time of day: no.  Association with  position: No    ??Other problems??:     ROS: No fever/ chills/ rash/ lymphadenopathy. No chest pain, SOB, GI or GU complaints. Except as per the HPI, all other systems were reviewed and are negative.     Physical Examination:   ??Blood pressure 110/68, pulse 75, height 1.656 m (5' 5.2"), weight 89.8 kg (197 lb 15.6 oz), SpO2 98 %.  Gen: Normal development.   Summary Findings:  Mental Status: Awake, alert and oriented. Normal speech, language, attention, memory and fund of information.  Head/ Face/ Scalp: NT/NC  Neck: ROM full, no spasm. Carotids 2+, no bruits. Occipital nuchal tenderness over left and right side L> R.  Fundoscopic exam: No papilledema   Cranial Nerve Findings: None  Motor, Sensory and Coordination Testing: Normal.  Reflex Changes: None  Gait and Function: Normal.     Data review:     Impression:   Chronic Migraine w/wo visual aura   Left occipital neuralgia  migraine with sensory aura (left sided heaviness/arm paresthesia)  Myofascial pain syndrome   - bilateral occipital nerve block offered and performd today. Previously left sided occipital nerve block had provided benefit for more than two months.   - Continue eletriptan 40 mg PRN   -Continue duloxetine 90 mg and amitriptyline to 75 mg (unable to tolerate 100 mg) for migraine prophylaxis.   -Continue  Zanaflex PRN for severe headaches, no more then twice a week.   -Informed the patient to take Naproxen prior menstrual period for 3-4 days along with Pepcid  -PT for myofascial pain syndrome  -RTC 3 months  -Of note, patient tried Botox once previously, however had some neck weakness.    -For future reference, IF patient would like to tried Botox again, avoid neck weakness by injecting shots more superiorly.     Kathyrn Sheriff, MD 12/13/16, 16:39   [place] [facility]        I saw and examined the patient.  I reviewed the resident's note.  I agree with the findings and plan of care as documented in the resident's note.  Any  exceptions/additions are edited/noted.    Nicola Police, MD

## 2016-12-14 NOTE — Addendum Note (Signed)
Addended byNicola Police: Trisha Ken on: 12/14/2016 08:41 AM     Modules accepted: Orders

## 2016-12-14 NOTE — Progress Notes (Signed)
Procedure: Bilateral Occipital Nerve Block.   The rationale, risks (including the risk of osteonecrosis if steroids are used), benefits, and alternatives of occipital nerve injections for headaches were reviewed with the patient. All questions answered, consent was given, and patient decided to proceed with the injection. Prior to starting the procedure, all pertinent records were reviewed, the nature of the procedure was explained, and the following details were verified: correct patient identity, procedure to be performed.    I then performed bilateral occipital nerve blocks today as follows: After cleaning the area, 2.5 mL of  0.5% bupivacaine was infiltrated in the area of the right and left greater occipital nerves and 1.5 mL in the area of the right and left lesser occipital nerves at the superior nuchal line using a 30 gauge needle. A total of 8 mL was injected. A total of 8 mg dexamethasone was injected with the bupivacaine.     Following the injection, pressure was applied to each puncture site, and the patient was observed. There were no apparent complications. Following an appropriate amount of observation, the patient was dismissed from the Clinic in good condition under patient's own power        Gloria PoliceUmer Winfrey Chillemi, MD  Assistant Professor of Neurology  [facility] [employer]

## 2017-05-02 ENCOUNTER — Encounter (INDEPENDENT_AMBULATORY_CARE_PROVIDER_SITE_OTHER): Payer: Self-pay

## 2019-12-31 ENCOUNTER — Other Ambulatory Visit (HOSPITAL_COMMUNITY): Payer: Self-pay

## 2019-12-31 LAB — EXTERNAL COVID-19 MOLECULAR RESULT: External 2019-n-CoV/SARS-CoV-2: POSITIVE — AB
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
